# Patient Record
Sex: Male | Born: 1935 | Race: White | Hispanic: No | Marital: Married | Smoking: Former smoker
Health system: Southern US, Community
[De-identification: ages and names within clinical notes are randomized; demographics above are authoritative.]

## PROBLEM LIST (undated history)

## (undated) DIAGNOSIS — IMO0002 Reserved for concepts with insufficient information to code with codable children: Secondary | ICD-10-CM

## (undated) DIAGNOSIS — I252 Old myocardial infarction: Secondary | ICD-10-CM

## (undated) DIAGNOSIS — I10 Essential (primary) hypertension: Secondary | ICD-10-CM

## (undated) HISTORY — PX: OTHER SURGICAL HISTORY: SHX169

---

## 1991-08-29 DIAGNOSIS — I252 Old myocardial infarction: Secondary | ICD-10-CM

## 1991-08-29 HISTORY — DX: Old myocardial infarction: I25.2

## 1999-04-23 ENCOUNTER — Emergency Department (HOSPITAL_COMMUNITY): Admission: EM | Admit: 1999-04-23 | Discharge: 1999-04-24 | Payer: Self-pay | Admitting: Emergency Medicine

## 1999-05-04 ENCOUNTER — Emergency Department (HOSPITAL_COMMUNITY): Admission: EM | Admit: 1999-05-04 | Discharge: 1999-05-04 | Payer: Self-pay | Admitting: Internal Medicine

## 2012-05-20 DIAGNOSIS — IMO0002 Reserved for concepts with insufficient information to code with codable children: Secondary | ICD-10-CM

## 2012-05-20 HISTORY — PX: OTHER SURGICAL HISTORY: SHX169

## 2012-05-20 HISTORY — DX: Reserved for concepts with insufficient information to code with codable children: IMO0002

## 2012-07-09 ENCOUNTER — Encounter: Payer: Self-pay | Admitting: Radiation Oncology

## 2012-07-10 ENCOUNTER — Ambulatory Visit
Admission: RE | Admit: 2012-07-10 | Discharge: 2012-07-10 | Disposition: A | Discharge status: Home / Self Care | Payer: Medicare Other | Source: Ambulatory Visit | Attending: Radiation Oncology | Admitting: Radiation Oncology

## 2012-07-10 ENCOUNTER — Encounter: Payer: Self-pay | Admitting: Radiation Oncology

## 2012-07-10 VITALS — BP 159/76 | HR 58 | Temp 97.6°F | Wt 185.3 lb

## 2012-07-10 DIAGNOSIS — C44621 Squamous cell carcinoma of skin of unspecified upper limb, including shoulder: Secondary | ICD-10-CM | POA: Insufficient documentation

## 2012-07-10 DIAGNOSIS — I10 Essential (primary) hypertension: Secondary | ICD-10-CM | POA: Insufficient documentation

## 2012-07-10 DIAGNOSIS — IMO0002 Reserved for concepts with insufficient information to code with codable children: Secondary | ICD-10-CM

## 2012-07-10 DIAGNOSIS — I2510 Atherosclerotic heart disease of native coronary artery without angina pectoris: Secondary | ICD-10-CM | POA: Insufficient documentation

## 2012-07-10 DIAGNOSIS — Z9861 Coronary angioplasty status: Secondary | ICD-10-CM | POA: Insufficient documentation

## 2012-07-10 DIAGNOSIS — I252 Old myocardial infarction: Secondary | ICD-10-CM | POA: Insufficient documentation

## 2012-07-10 DIAGNOSIS — Z87891 Personal history of nicotine dependence: Secondary | ICD-10-CM | POA: Insufficient documentation

## 2012-07-10 DIAGNOSIS — Z09 Encounter for follow-up examination after completed treatment for conditions other than malignant neoplasm: Secondary | ICD-10-CM | POA: Insufficient documentation

## 2012-07-10 HISTORY — DX: Old myocardial infarction: I25.2

## 2012-07-10 HISTORY — DX: Reserved for concepts with insufficient information to code with codable children: IMO0002

## 2012-07-10 HISTORY — DX: Essential (primary) hypertension: I10

## 2012-07-10 NOTE — Progress Notes (Signed)
Here today for consultation for skin cancer on the left fourth finger.  Currently the area is red and without drainage with sheared skin. Demonstrates good movement of finger.  Dr. Alean Rinne has proposed surgery to the extent that a hand surgeon will be involved and Jon Lam would experience limitation of motion of finger and would require a skin graft so Jon Lam is exploring radiation as an alternative today.

## 2012-07-10 NOTE — Progress Notes (Signed)
Radiation Oncology         [phone] ________________________________  Initial outpatient Consultation  Name: Jon Lam MRN: [id number]  Date: 07/10/2012  DOB: 05-28-1932  CC:No primary provider on file.  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 male who is kindly sent by Dr. Alean Rinne of the [facility] for a skin cancer of the left fourth finger. 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 The patient and his wife report that this lesion has been slowly progressive over the past 2-3 decades. He has discussed surgery with Dr. Alean Rinne which would involve a skin graft and potential decrease in mobility of that finger in the long term. He is exploring other treatment options.   The patient is otherwise in his usual state of health. He is retired. He is very active with his hands and does a lot of work around the house.  He is here with his wife today of 52 years, who recently completed radiotherapy in our department. She is very supportive.  PREVIOUS RADIATION THERAPY: No  PAST MEDICAL HISTORY:  has a past medical history of Squamous cell carcinoma (05/20/12); Hypertension; and History of heart attack (1993).    PAST SURGICAL HISTORY: Past Surgical History  Procedure Date  . Cardia stents 2003 0r 2004  . Biopsy of finger 05/20/12    Left 4th finger    FAMILY HISTORY: family history includes Esophageal cancer in his paternal uncle; Lung cancer in his paternal aunt and paternal uncle; Pancreatic cancer in his brother; and Skin cancer in his father.  SOCIAL HISTORY:  reports that he quit smoking about 20 years ago. His smoking use included Cigarettes. He has a 80 pack-year smoking history. He does not have any smokeless tobacco history on file. He reports that he  does not drink alcohol or use illicit drugs.  ALLERGIES: Penicillins  MEDICATIONS:  Current Outpatient Prescriptions  Medication Sig Dispense Refill  . aspirin (ASPIRIN EC) 81 MG EC tablet Take 81 mg by mouth daily. Swallow whole.      . Coenzyme Q10 100 MG TABS Take by mouth.      . folic acid (FOLVITE) 1 MG tablet Take 1 mg by mouth daily.      Marland Kitchen lisinopril (PRINIVIL,ZESTRIL) 10 MG tablet Take 10 mg by mouth daily.      . metoprolol tartrate (LOPRESSOR) 25 MG tablet Take 25 mg by mouth once.      . polycarbophil (FIBERCON) 625 MG tablet Take 500 mg by mouth daily.      Bertram Gala Glycol-Propyl Glycol (SYSTANE) 0.4-0.3 % SOLN Apply 1 drop to eye as needed.      . pravastatin (PRAVACHOL) 40 MG tablet Take 40 mg by mouth daily.        REVIEW OF SYSTEMS:  Notable for that above. He reports relatively good range of motion in his left finger. He reports that the skin has started to crack /breakdown in the past few months over this lesion.   PHYSICAL EXAM:  weight is 185 lb 4.8 oz (84.052 kg). His temperature is 97.6 F (36.4 C). His blood pressure is 159/76 and his pulse is 58.   Well-nourished and in no acute distress. His left fourth digit demonstrates a  patch (approximately 3 cm dimension) of erythema/induration that wraps  around > one third the circumference of the mid finger, around the joint. Good range of motion in the finger. Good radial pulses in the left wrist. Finger nail bed is adequately perfused  LABORATORY DATA:  No results found for this basename: WBC,  HGB,  HCT,  MCV,  PLT   CMP  No results found for this basename: na,  k,  cl,  co2,  glucose,  bun,  creatinine,  calcium,  prot,  albumin,  ast,  alt,  alkphos,  bilitot,  gfrnonaa,  gfraa        RADIOGRAPHY: No results found.    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 Unfortunately, it is in the distal portion of the body  (finger)  which raises risks for radiotherapy. I explained to the patient that radiotherapy in this area would carry a risk of small vessel damage and nonhealing ulcers. In the worse case scenario, this could require an amputation of the finger if he does not heal adequately. He also understands that  radiotherapy to this area would require a significant dose to be given to the tendon and bone of the finger which could lead to healing issues and lack of mobility in the finger. Ultimately, I think the best option for him is to proceed with surgery. He is glad that he could meet with me to explore all of his options and now is more enthusiastic about surgery.  Of note, one of my senior partners examined the patient with me and was in agreement with this recommendation. He expressed the same thoughts/recommendations to the patient.  I spent 40 minutes minutes face to face with the patient and more than 50% of that time was spent in counseling and/or coordination of care.   __________________________________________   Lonie Peak, MD

## 2012-07-10 NOTE — Progress Notes (Signed)
See progress note under physician encounter. 

## 2012-07-10 NOTE — Addendum Note (Signed)
Encounter addended by: Stassi Fadely Mintz Zakaree Mcclenahan, RN on: 07/10/2012  8:10 PM<BR>     Documentation filed: Charges VN

## 2012-07-11 NOTE — Addendum Note (Signed)
Encounter addended by: Delynn Flavin, RN on: 07/11/2012  3:01 PM<BR>     Documentation filed: Charges VN

## 2012-07-11 NOTE — Addendum Note (Signed)
Encounter addended by: Delynn Flavin, RN on: 07/11/2012  5:46 PM<BR>     Documentation filed: Charges VN

## 2012-07-16 NOTE — Addendum Note (Signed)
Encounter addended by: Delynn Flavin, RN on: 07/16/2012 10:47 AM<BR>     Documentation filed: Charges VN

## 2012-07-16 NOTE — Addendum Note (Signed)
Encounter addended by: Delynn Flavin, RN on: 07/16/2012  5:58 PM<BR>     Documentation filed: Charges VN

## 2013-04-02 ENCOUNTER — Ambulatory Visit: Payer: Medicare Other | Attending: Orthopedic Surgery | Admitting: Physical Therapy

## 2013-04-02 DIAGNOSIS — M25559 Pain in unspecified hip: Secondary | ICD-10-CM | POA: Insufficient documentation

## 2013-04-02 DIAGNOSIS — IMO0001 Reserved for inherently not codable concepts without codable children: Secondary | ICD-10-CM | POA: Insufficient documentation

## 2013-04-08 ENCOUNTER — Ambulatory Visit: Payer: Medicare Other | Admitting: Physical Therapy

## 2013-04-11 ENCOUNTER — Ambulatory Visit: Payer: Medicare Other | Admitting: Physical Therapy

## 2013-04-15 ENCOUNTER — Ambulatory Visit: Payer: Medicare Other | Admitting: Physical Therapy

## 2013-04-17 ENCOUNTER — Ambulatory Visit: Payer: Medicare Other | Admitting: Physical Therapy

## 2013-04-22 ENCOUNTER — Ambulatory Visit: Payer: Medicare Other | Admitting: Physical Therapy

## 2013-04-24 ENCOUNTER — Ambulatory Visit: Payer: Medicare Other | Admitting: Physical Therapy

## 2019-07-16 ENCOUNTER — Emergency Department (HOSPITAL_BASED_OUTPATIENT_CLINIC_OR_DEPARTMENT_OTHER): Payer: Medicare Other

## 2019-07-16 ENCOUNTER — Other Ambulatory Visit: Payer: Self-pay

## 2019-07-16 ENCOUNTER — Encounter (HOSPITAL_BASED_OUTPATIENT_CLINIC_OR_DEPARTMENT_OTHER): Payer: Self-pay | Admitting: *Deleted

## 2019-07-16 ENCOUNTER — Emergency Department (HOSPITAL_BASED_OUTPATIENT_CLINIC_OR_DEPARTMENT_OTHER)
Admission: EM | Admit: 2019-07-16 | Discharge: 2019-07-16 | Disposition: A | Discharge status: Home / Self Care | Payer: Medicare Other | Attending: Emergency Medicine | Admitting: Emergency Medicine

## 2019-07-16 DIAGNOSIS — Z87891 Personal history of nicotine dependence: Secondary | ICD-10-CM | POA: Diagnosis not present

## 2019-07-16 DIAGNOSIS — Z79899 Other long term (current) drug therapy: Secondary | ICD-10-CM | POA: Diagnosis not present

## 2019-07-16 DIAGNOSIS — Z7982 Long term (current) use of aspirin: Secondary | ICD-10-CM | POA: Insufficient documentation

## 2019-07-16 DIAGNOSIS — R519 Headache, unspecified: Secondary | ICD-10-CM | POA: Insufficient documentation

## 2019-07-16 DIAGNOSIS — Z85828 Personal history of other malignant neoplasm of skin: Secondary | ICD-10-CM | POA: Insufficient documentation

## 2019-07-16 DIAGNOSIS — S0990XA Unspecified injury of head, initial encounter: Secondary | ICD-10-CM

## 2019-07-16 DIAGNOSIS — I252 Old myocardial infarction: Secondary | ICD-10-CM | POA: Insufficient documentation

## 2019-07-16 DIAGNOSIS — Y9389 Activity, other specified: Secondary | ICD-10-CM | POA: Insufficient documentation

## 2019-07-16 DIAGNOSIS — Y999 Unspecified external cause status: Secondary | ICD-10-CM | POA: Diagnosis not present

## 2019-07-16 DIAGNOSIS — I10 Essential (primary) hypertension: Secondary | ICD-10-CM | POA: Insufficient documentation

## 2019-07-16 DIAGNOSIS — Y929 Unspecified place or not applicable: Secondary | ICD-10-CM | POA: Diagnosis not present

## 2019-07-16 DIAGNOSIS — W228XXA Striking against or struck by other objects, initial encounter: Secondary | ICD-10-CM | POA: Diagnosis not present

## 2019-07-16 DIAGNOSIS — S0001XA Abrasion of scalp, initial encounter: Secondary | ICD-10-CM | POA: Insufficient documentation
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
## 2019-07-16 NOTE — Discharge Instructions (Signed)
Your CT scan of your head was reassuring today. Please take Tylenol as needed for a headache. Follow up with your PCP regarding your ED visit today as well as tetanus status.
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
## 2019-10-30 ENCOUNTER — Ambulatory Visit: Payer: Medicare Other | Attending: Internal Medicine

## 2019-10-30 DIAGNOSIS — Z23 Encounter for immunization: Secondary | ICD-10-CM

## 2019-10-30 NOTE — Progress Notes (Signed)
   Covid-19 Vaccination Clinic  Name:  LENNARD TUELL    MRN: [id number] DOB: 1932/03/10  10/30/2019  Mr. Plymire was observed post Covid-19 immunization for 15 minutes without incident. He was provided with Vaccine Information Sheet and instruction to access the V-Safe system.   Mr. Veasley was instructed to call 911 with any severe reactions post vaccine: Marland Kitchen Difficulty breathing  . Swelling of face and throat  . A fast heartbeat  . A bad rash all over body  . Dizziness and weakness

## 2019-10-31 ENCOUNTER — Telehealth: Payer: Self-pay | Admitting: *Deleted

## 2019-10-31 NOTE — Telephone Encounter (Signed)
Copied from [place] [id number]. Topic: General - Other >> Oct 31, 2019  9:04 AM Jodie Echevaria wrote: Reason for CRM: Patient daughter Janith Lima called to inquire of a nurse about the patient taking medication before getting his covid vaccine. Ph# [phone]  Contacted pt's daughter regarding his medication prior to his COVID vaccine; she states that the pt takes Aleve every morning for arthritis pain; explained that premedication for fever (Tylenol or Ibuprofen) is discouraged; she verbalized understanding.

## 2019-11-26 ENCOUNTER — Ambulatory Visit: Payer: Medicare Other | Attending: Internal Medicine

## 2019-11-26 DIAGNOSIS — Z23 Encounter for immunization: Secondary | ICD-10-CM

## 2019-11-26 NOTE — Progress Notes (Signed)
   Covid-19 Vaccination Clinic  Name:  Jon Lam    MRN: [id number] DOB: Jan 06, 1932  11/26/2019  [name] was observed post Covid-19 immunization for 15 minutes without incident. He was provided with Vaccine Information Sheet and instruction to access the V-Safe system.   [name] was instructed to call 911 with any severe reactions post vaccine: Marland Kitchen Difficulty breathing  . Swelling of face and throat  . A fast heartbeat  . A bad rash all over body  . Dizziness and weakness   Immunizations Administered    Name Date Dose VIS Date Route   Pfizer COVID-19 Vaccine 11/26/2019  9:35 AM 0.3 mL 08/08/2019 Intramuscular   Manufacturer: [employer], [employer]   Lot: [id number]   [place]: [id number]

## 2021-05-11 ENCOUNTER — Emergency Department (HOSPITAL_BASED_OUTPATIENT_CLINIC_OR_DEPARTMENT_OTHER)
Admission: EM | Admit: 2021-05-11 | Discharge: 2021-05-11 | Disposition: A | Discharge status: Home / Self Care | Payer: No Typology Code available for payment source | Attending: Emergency Medicine | Admitting: Emergency Medicine

## 2021-05-11 ENCOUNTER — Emergency Department (HOSPITAL_BASED_OUTPATIENT_CLINIC_OR_DEPARTMENT_OTHER): Payer: No Typology Code available for payment source

## 2021-05-11 ENCOUNTER — Other Ambulatory Visit: Payer: Self-pay

## 2021-05-11 ENCOUNTER — Encounter (HOSPITAL_BASED_OUTPATIENT_CLINIC_OR_DEPARTMENT_OTHER): Payer: Self-pay | Admitting: Emergency Medicine

## 2021-05-11 DIAGNOSIS — Z85828 Personal history of other malignant neoplasm of skin: Secondary | ICD-10-CM | POA: Diagnosis not present

## 2021-05-11 DIAGNOSIS — Z87891 Personal history of nicotine dependence: Secondary | ICD-10-CM | POA: Insufficient documentation

## 2021-05-11 DIAGNOSIS — S60011A Contusion of right thumb without damage to nail, initial encounter: Secondary | ICD-10-CM | POA: Insufficient documentation

## 2021-05-11 DIAGNOSIS — I10 Essential (primary) hypertension: Secondary | ICD-10-CM | POA: Insufficient documentation

## 2021-05-11 DIAGNOSIS — Y92410 Unspecified street and highway as the place of occurrence of the external cause: Secondary | ICD-10-CM | POA: Insufficient documentation

## 2021-05-11 DIAGNOSIS — Z79899 Other long term (current) drug therapy: Secondary | ICD-10-CM | POA: Insufficient documentation

## 2021-05-11 DIAGNOSIS — Z7982 Long term (current) use of aspirin: Secondary | ICD-10-CM | POA: Diagnosis not present

## 2021-05-11 DIAGNOSIS — M25511 Pain in right shoulder: Secondary | ICD-10-CM | POA: Diagnosis not present

## 2021-05-11 DIAGNOSIS — S6991XA Unspecified injury of right wrist, hand and finger(s), initial encounter: Secondary | ICD-10-CM | POA: Diagnosis present

## 2021-05-11 MED ORDER — DICLOFENAC SODIUM 1 % EX GEL: 2.0000 g | Freq: Four times a day (QID) | CUTANEOUS | 0 refills | Status: AC

## 2021-05-11 MED ORDER — DICLOFENAC SODIUM 1 % EX GEL: 2.0000 g | Freq: Four times a day (QID) | CUTANEOUS | 0 refills | Status: DC
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
## 2021-05-26 ENCOUNTER — Other Ambulatory Visit: Payer: Self-pay

## 2021-05-26 ENCOUNTER — Ambulatory Visit: Payer: PRIVATE HEALTH INSURANCE | Admitting: Surgical

## 2021-05-26 ENCOUNTER — Ambulatory Visit (INDEPENDENT_AMBULATORY_CARE_PROVIDER_SITE_OTHER): Payer: Medicare Other | Admitting: Orthopedic Surgery

## 2021-05-26 DIAGNOSIS — M25511 Pain in right shoulder: Secondary | ICD-10-CM | POA: Diagnosis not present

## 2021-05-30 ENCOUNTER — Encounter: Payer: Self-pay | Admitting: Orthopedic Surgery
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
## 2021-06-02 ENCOUNTER — Telehealth: Payer: Self-pay | Admitting: Orthopedic Surgery

## 2021-06-02 NOTE — Telephone Encounter (Signed)
Called patient left message on voicemail to return call and schedule an MRI review appointment with Dr Marlou Sa

## 2021-06-04 ENCOUNTER — Ambulatory Visit (HOSPITAL_BASED_OUTPATIENT_CLINIC_OR_DEPARTMENT_OTHER)
Admission: RE | Admit: 2021-06-04 | Discharge: 2021-06-04 | Disposition: A | Discharge status: Home / Self Care | Payer: Medicare Other | Source: Ambulatory Visit | Attending: Orthopedic Surgery | Admitting: Orthopedic Surgery

## 2021-06-04 ENCOUNTER — Other Ambulatory Visit: Payer: Self-pay

## 2021-06-04 DIAGNOSIS — M25511 Pain in right shoulder: Secondary | ICD-10-CM | POA: Diagnosis present

## 2021-06-15 ENCOUNTER — Other Ambulatory Visit: Payer: Self-pay

## 2021-06-15 ENCOUNTER — Ambulatory Visit (INDEPENDENT_AMBULATORY_CARE_PROVIDER_SITE_OTHER): Payer: Medicare Other | Admitting: Orthopedic Surgery

## 2021-06-15 DIAGNOSIS — M25511 Pain in right shoulder: Secondary | ICD-10-CM

## 2021-06-15 DIAGNOSIS — S46011A Strain of muscle(s) and tendon(s) of the rotator cuff of right shoulder, initial encounter: Secondary | ICD-10-CM | POA: Diagnosis not present

## 2021-06-19 ENCOUNTER — Encounter: Payer: Self-pay | Admitting: Orthopedic Surgery

## 2021-06-19 MED ORDER — BUPIVACAINE HCL 0.5 % IJ SOLN
9.0000 mL | INTRAMUSCULAR | Status: AC | PRN
  Administered 2021-06-15: 9 mL via INTRA_ARTICULAR

## 2021-06-19 MED ORDER — METHYLPREDNISOLONE ACETATE 40 MG/ML IJ SUSP
40.0000 mg | INTRAMUSCULAR | Status: AC | PRN
  Administered 2021-06-15: 40 mg via INTRA_ARTICULAR

## 2021-06-19 MED ORDER — LIDOCAINE HCL 1 % IJ SOLN
5.0000 mL | INTRAMUSCULAR | Status: AC | PRN
  Administered 2021-06-15: 5 mL

## 2021-06-19 NOTE — Progress Notes (Signed)
Office Visit Note   Patient: Jon Lam           Date of Birth: [birth year]           MRN: [id number] Visit Date: 06/15/2021 Requested by: Charleston Poot, MD [address] [place],  [place] [address] PCP: Charleston Poot, MD  Subjective: Chief Complaint  Patient presents with   Other    Scan review    HPI: Jon Lam is a 87 y.o. male who presents to the office for MRI review. Patient denies any changes in symptoms.  Continues to complain mainly of anterior lateral right shoulder pain.  No improvement since last visit according to him.  He still wakes with pain.  Particularly bothersome when he tries to use his 0 turn lawnmower.  He is also closer to considering intervention for his lumbar spine which is bothering him more than the shoulder.  MRI results revealed: MR SHOULDER RIGHT WO CONTRAST  Result Date: 06/06/2021 CLINICAL DATA:  MR right shoulder eval RCT EXAM: MRI OF THE RIGHT SHOULDER WITHOUT CONTRAST TECHNIQUE: Multiplanar, multisequence MR imaging of the shoulder was performed. No intravenous contrast was administered. COMPARISON:  Radiograph 05/11/2021 FINDINGS: Rotator cuff: High-grade, essentially full-thickness tears of the supraspinatus and infraspinatus tendons at the footprint, with few intact far anterior supraspinatus tendon fibers. Infraspinatus tearing is full width with 1.8 cm retraction. Teres minor tendon is intact. Subscapularis tendon is intact. Muscles: Reactive edema along the surfaces of the supraspinatus infraspinatus muscles and within the muscle belly of the infraspinatus. No significant muscle atrophy. Biceps Long Head: Intraarticular and extraarticular portions of the biceps tendon are intact. Acromioclavicular Joint: Moderate arthropathy of the acromioclavicular joint. Small amount of subacromial/subdeltoid bursal fluid likely related to the cuff tear. Glenohumeral Joint: Small glenohumeral joint effusion. Mild chondrosis. Labrum:  Degenerative posterosuperior labral tearing. Bones: No fracture or dislocation. No aggressive osseous lesion. Other: No additional findings. IMPRESSION: High-grade, essentially full-thickness tears of the supraspinatus and infraspinatus tendons at the footprint, with few intact far anterior supraspinatus tendon fibers. Some retracted infraspinatus fibers by approximately 1.8 cm. Distension of the subacromial-subdeltoid bursa. No significant muscle atrophy. Degenerative posterosuperior labral tearing. Moderate AC joint arthropathy. Electronically Signed   By: Maurine Simmering M.D.   On: 06/06/2021 16:50                 ROS: All systems reviewed are negative as they relate to the chief complaint within the history of present illness.  Patient denies fevers or chills.  Assessment & Plan: Visit Diagnoses:  1. Traumatic complete tear of right rotator cuff, initial encounter     Plan: Jon Lam is a 87 y.o. male who presents to the office for evaluation of right shoulder pain.  He is here to review MRI of the right shoulder.  MRI does reveal full-thickness tears of the supraspinatus and infraspinatus.  Most of his weakness seems to be external rotation with very well-preserved supraspinatus and subscapularis strength on examination today.  His main complaint is pain and his function is much improved compared with his last office visit.  At his last visit, he was only able to achieve 90 degrees of active forward flexion abduction but now he has full active range of motion of the right shoulder when compared with passive motion.  He states that this pain is bothering him enough to consider surgical intervention but with pain being the main complaint, he may have significant improvement with right shoulder injection.  Discussed surgical options available to the patient which would likely be primary repair of the torn rotator cuff, given his lack of atrophy.  Discussed recovery timeframe and risks and benefits of  the procedure.  With his medical history and advanced age, he would like to proceed with cortisone injection first to see how this does for him.  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  Right shoulder injection successfully administered and patient tolerated the procedure well.  He will follow-up with the office in mid December with consideration for surgery if no significant improvement.  Follow-Up Instructions: No follow-ups on file.   Orders:  No orders of the defined types were placed in this encounter.  No orders of the defined types were placed in this encounter.     Procedures: Large Joint Inj: R subacromial bursa on 06/15/2021 4:16 PM Indications: diagnostic evaluation and pain Details: 18 G 1.5 in needle, posterior approach  Arthrogram: No  Medications: 9 mL bupivacaine 0.5 %; 40 mg methylPREDNISolone acetate 40 MG/ML; 5 mL lidocaine 1 % Outcome: tolerated well, no immediate complications Procedure, treatment alternatives, risks and benefits explained, specific risks discussed. Consent was given by the patient. Immediately prior to procedure a time out was called to verify the correct patient, procedure, equipment, support staff and site/side marked as required. Patient was prepped and draped in the usual sterile fashion.      Clinical Data: No additional findings.  Objective: Vital Signs: There were no vitals taken for this visit.  Physical Exam:  Constitutional: Patient appears well-developed HEENT:  Head: Normocephalic Eyes:EOM are normal Neck: Normal range of motion Cardiovascular: Normal rate Pulmonary/chest: Effort normal Neurologic: Patient is alert Skin: Skin is warm Psychiatric: Patient has normal mood and affect  Ortho Exam: Ortho exam demonstrates right shoulder with 45 degrees external rotation, 95 degrees abduction, 160 degrees forward flexion.  Active range of motion equivalent to passive range of motion.  Excellent 5/5  strength of supraspinatus and subscapularis.  4/5 motor strength of external rotation of the right shoulder.  Negative external rotation lag sign.  Negative Hornblower sign.  Negative belly press test.  Axillary nerve intact with deltoid firing.  5/5 motor strength of bilateral grip strength, finger abduction, pronation/supination, bicep, tricep, deltoid.  Specialty Comments:  No specialty comments available.  Imaging: No results found.   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 07/10/2012   Past Medical History:  Diagnosis Date   History of heart attack 1993   Hypertension    Squamous cell carcinoma 05/20/12   Left fourth Garth Bigness with Actinic Keratoses    Family History  Problem Relation Age of Onset   Skin cancer Father    Lung cancer Paternal Uncle    Pancreatic cancer Brother    Lung cancer Paternal Aunt    Esophageal cancer Paternal Uncle     Past Surgical History:  Procedure Laterality Date   Biopsy of Finger  05/20/12   Left 4th finger   Cardia Stents  2003 0r 2004   Social History   Occupational History   Occupation: retired    Fish farm manager: [employer]  Tobacco Use   Smoking status: Former    Packs/day: 2.00    Years: 40.00    Pack years: 80.00    Types: Cigarettes    Quit date: 09/10/1991    Years since quitting: 29.7   Smokeless tobacco: Never  Substance and Sexual  Activity   Alcohol use: No   Drug use: No   Sexual activity: Not on file

## 2021-07-12 ENCOUNTER — Ambulatory Visit: Payer: Medicare Other | Attending: Physician Assistant | Admitting: Physical Therapy

## 2021-07-12 ENCOUNTER — Other Ambulatory Visit: Payer: Self-pay

## 2021-07-12 ENCOUNTER — Encounter: Payer: Self-pay | Admitting: Physical Therapy

## 2021-07-12 DIAGNOSIS — M79605 Pain in left leg: Secondary | ICD-10-CM | POA: Diagnosis present

## 2021-07-12 DIAGNOSIS — M25651 Stiffness of right hip, not elsewhere classified: Secondary | ICD-10-CM | POA: Diagnosis present

## 2021-07-12 DIAGNOSIS — M25652 Stiffness of left hip, not elsewhere classified: Secondary | ICD-10-CM | POA: Insufficient documentation

## 2021-07-12 DIAGNOSIS — R2689 Other abnormalities of gait and mobility: Secondary | ICD-10-CM | POA: Diagnosis present
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
## 2021-07-12 NOTE — Addendum Note (Signed)
Addended by: Hunt Oris on: 07/12/2021 02:57 PM   Modules accepted: Orders

## 2021-07-14 ENCOUNTER — Ambulatory Visit: Payer: Medicare Other | Admitting: Physical Therapy

## 2021-07-14 ENCOUNTER — Encounter: Payer: Self-pay | Admitting: Physical Therapy

## 2021-07-14 ENCOUNTER — Other Ambulatory Visit: Payer: Self-pay

## 2021-07-14 DIAGNOSIS — M79605 Pain in left leg: Secondary | ICD-10-CM

## 2021-07-14 DIAGNOSIS — M25651 Stiffness of right hip, not elsewhere classified: Secondary | ICD-10-CM

## 2021-07-14 DIAGNOSIS — M25652 Stiffness of left hip, not elsewhere classified: Secondary | ICD-10-CM | POA: Diagnosis not present

## 2021-07-14 DIAGNOSIS — R2689 Other abnormalities of gait and mobility: Secondary | ICD-10-CM
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
## 2021-07-19 ENCOUNTER — Ambulatory Visit: Payer: Medicare Other | Admitting: Physical Therapy

## 2021-07-19 ENCOUNTER — Other Ambulatory Visit: Payer: Self-pay

## 2021-07-19 DIAGNOSIS — M79605 Pain in left leg: Secondary | ICD-10-CM

## 2021-07-19 DIAGNOSIS — M25652 Stiffness of left hip, not elsewhere classified: Secondary | ICD-10-CM

## 2021-07-19 DIAGNOSIS — M25651 Stiffness of right hip, not elsewhere classified: Secondary | ICD-10-CM

## 2021-07-19 DIAGNOSIS — R2689 Other abnormalities of gait and mobility: Secondary | ICD-10-CM

## 2021-07-19 NOTE — Therapy (Signed)
[place]. [place], [place], [address] Phone: [phone]   Fax:  [phone]  Physical Therapy Treatment  Patient Details  Name: Jon Lam MRN: [id number] Date of Birth: 04/04/1932 Referring Provider (PT): Jon Bayley PA   Encounter Date: 07/19/2021   PT End of Session - 07/19/21 0916     Visit Number 3    Number of Visits 13    Date for PT Re-Evaluation 08/23/21    Authorization Type MCR and Combined    Authorization Time Period 07/12/21 to 08/23/21    PT Start Time 0835    PT Stop Time 0920    PT Time Calculation (min) 45 min             Past Medical History:  Diagnosis Date   History of heart attack 1993   Hypertension    Squamous cell carcinoma 05/20/12   Left fourth Jon Lam with Actinic Keratoses    Past Surgical History:  Procedure Laterality Date   Biopsy of Finger  05/20/12   Left 4th finger   Cardia Stents  2003 0r 2004    There were no vitals filed for this visit.   Subjective Assessment - 07/19/21 0835     Subjective HEP hurts and I just can't get the hang of it- but working on it. most pain back down left leg- shld are altogether different.sitting in chair and leaning away from left side gives some relief. pt states ready to see a surgeon- he was ready to operate 3 years ago    Currently in Pain? Yes    Pain Score 5                                [facility] Adult PT Treatment/Exercise - 07/19/21 0001       Lumbar Exercises: Aerobic   Nustep L 5 5 min      Lumbar Exercises: Seated   Sit to Stand 10 reps   wt ball chest press   Other Seated Lumbar Exercises dyna disc pelvic ROM 10 each with cuing   on disc LAQ,hip flex and abd 10 each BIL   Other Seated Lumbar Exercises black tband trunk ext 2 set s10   seated row 2 sets 10 green tband     Lumbar Exercises: Supine   Clam 15 reps   green tband   Bent Knee Raise 20 reps   green tband   Bridge  10 reps;Non-compliant   feet on ball plus KTC and obl   Other Supine Lumbar Exercises ball squeeze 15x      Manual Therapy   Manual Therapy Passive ROM;Neural Stretch;Manual Traction    Passive ROM LE and trunk    Manual Traction with Sheet - NE    Neural Stretch Left LE NT stretching                       PT Short Term Goals - 07/19/21 0917       PT SHORT TERM GOAL #1   Title Will be independent with appropriate initial HEP to be progressed PRN    Status Achieved      PT SHORT TERM GOAL #2   Title Will be able to walk at least 137f with LRAD and no increase in pain    Status Partially Met      PT SHORT TERM GOAL #3  Title Will report pain as being no more than 3/10 in intensity with functional activities with radiation no further than mid thigh    Status Partially Met      PT SHORT TERM GOAL #4   Title Will be able to state importance of not keeping wallet/hard objects in back pocket to prevent further compression of sciatic nerve in sitting    Status Achieved               PT Long Term Goals - 07/12/21 1216       PT LONG TERM GOAL #1   Title Will demonstrate at least a 50% improvement in lumbar ROM and hip flexibility in order to reduce pain and improve funcitonal activity tolerance    Time 6    Period Weeks    Status New    Target Date 08/23/21      PT LONG TERM GOAL #2   Title Will be able to tolerate walking community distances (>1042f) with LRAD with pain no more than 2/10 (and no radiation past ischial tuberosity) and no rest breaks    Time 6    Period Weeks    Status New      PT LONG TERM GOAL #3   Title Will be able to navigate stairs with U UE support and no increase in pain in order to improve general mobility    Time 6    Period Weeks    Status New      PT LONG TERM GOAL #4   Title Will demonstrate good biomechanics for floor to waist height lifting of 25# object in order to prevent recurrence/exacerbation of pain    Time 6     Period Weeks    Status New                   Plan - 07/19/21 0920     Clinical Impression Statement STG partially met. pt verb working on HEP And able ot demo. progressed ther ex today for ROM,stab and core actviation with cuing needed and he did well    PT Treatment/Interventions ADLs/Self Care Home Management;Cryotherapy;Electrical Stimulation;Iontophoresis 447mml Dexamethasone;Moist Heat;Ultrasound;Traction;DME Instruction;Gait training;Functional mobility training;Therapeutic activities;Therapeutic exercise;Balance training;Neuromuscular re-education;Patient/family education;Manual techniques;Dry needling;Taping    PT Next Visit Plan focus on general mobility, stretching, posture training, biomechanics             Patient will benefit from skilled therapeutic intervention in order to improve the following deficits and impairments:  Decreased mobility, Difficulty walking, Hypomobility, Improper body mechanics, Decreased activity tolerance, Impaired flexibility, Increased fascial restricitons, Postural dysfunction, Pain  Visit Diagnosis: Stiffness of left hip, not elsewhere classified  Stiffness of right hip, not elsewhere classified  Other abnormalities of gait and mobility  Pain in left leg     Problem List Patient Active Problem List   Diagnosis Date Noted   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 07/10/2012    Jon Lam,Jon Lam, PTA 07/19/2021, 9:21 AM  Jon Lam: [phone] Fax:  [phone]: Jon Lam: [id number] of Birth: 1105-24-1933

## 2021-07-26 ENCOUNTER — Ambulatory Visit: Payer: Medicare Other | Admitting: Physical Therapy

## 2021-07-26 ENCOUNTER — Other Ambulatory Visit: Payer: Self-pay

## 2021-07-26 DIAGNOSIS — M79605 Pain in left leg: Secondary | ICD-10-CM

## 2021-07-26 DIAGNOSIS — M25652 Stiffness of left hip, not elsewhere classified: Secondary | ICD-10-CM | POA: Diagnosis not present

## 2021-07-26 DIAGNOSIS — R2689 Other abnormalities of gait and mobility: Secondary | ICD-10-CM

## 2021-07-26 NOTE — Therapy (Signed)
[place]. 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   Subjective Assessment - 07/26/21 0842     Subjective left knee 9/10,hip 4/10. i think something else is going on with that knee. i hurt after last session    Currently in Pain? Yes                               [facility] Adult PT Treatment/Exercise - 07/26/21 0001       Lumbar Exercises: Aerobic   Nustep L 5 2mn      Lumbar Exercises: Machines for Strengthening   Cybex Lumbar Extension black tband 2 sets 10    Other Lumbar Machine Exercise row and lat 25# 2 sets 10      Lumbar Exercises: Seated   Other Seated Lumbar Exercises dyna disc pelvic ROM 10 each with cuing   LAQ,hip flex and abd on disc 10 x with red tband     Lumbar Exercises: Supine   Pelvic Tilt 10 reps   3 sec with tactile cuing   Clam 15 reps   tband   Bent Knee Raise 20 reps   tband     Knee/Hip Exercises: Standing   Other Standing Knee Exercises red tband hip ext and abd with UE support 10 x      Manual Therapy   Manual Therapy Passive ROM    Passive ROM LE and trunk                        PT Short Term Goals - 07/19/21 0917       PT SHORT TERM GOAL #1   Title Will be independent with appropriate initial HEP to be progressed PRN    Status Achieved      PT SHORT TERM GOAL #2   Title Will be able to walk at least 1077fwith LRAD and no increase in pain    Status Partially Met      PT SHORT TERM GOAL #3   Title Will report pain as being no more than 3/10 in intensity with functional activities with radiation no further than mid thigh    Status Partially Met      PT SHORT TERM GOAL #4   Title Will be able to state importance of not keeping wallet/hard objects in back pocket to prevent further compression of sciatic nerve in  sitting    Status Achieved               PT Long Term Goals - 07/12/21 1216       PT LONG TERM GOAL #1   Title Will demonstrate at least a 50% improvement in lumbar ROM and hip flexibility in order to reduce pain and improve funcitonal activity tolerance    Time 6    Period Weeks    Status New    Target Date 08/23/21      PT LONG TERM GOAL #2   Title Will be able to tolerate walking community distances (>1072f) with LRAD with pain no more than 2/10 (and no radiation past ischial tuberosity) and no rest breaks    Time 6    Period Weeks    Status New      PT LONG TERM GOAL #3   Title Will be able to navigate stairs with U UE support and no increase in pain in order to improve general mobility    Time 6    Period Weeks    Status New      PT LONG TERM GOAL #4   Title Will demonstrate good biomechanics for floor to waist height lifting of 25# object in order to prevent recurrence/exacerbation of pain    Time 6    Period Weeks    Status New                   Plan - 07/26/21 0919     Clinical Impression Statement progressed ex with wts and core stab with verb and tactile cuing needed. minimal increased pain after session. explained ot pt and daughter it will take time to get pt looser and stronger  which with hopefully decrease pain.    PT Treatment/Interventions ADLs/Self Care Home Management;Cryotherapy;Electrical Stimulation;Iontophoresis 411mml Dexamethasone;Moist Heat;Ultrasound;Traction;DME Instruction;Gait training;Functional mobility training;Therapeutic activities;Therapeutic exercise;Balance training;Neuromuscular re-education;Patient/family education;Manual techniques;Dry needling;Taping    PT Next Visit Plan focus on general mobility, stretching, posture training, biomechanics             Patient will benefit from skilled therapeutic intervention in order to improve the following deficits and impairments:  Decreased mobility, Difficulty walking, Hypomobility, Improper body mechanics, Decreased activity tolerance, Impaired flexibility, Increased fascial restricitons, Postural dysfunction, Pain  Visit Diagnosis: Stiffness of left hip, not elsewhere classified  Other abnormalities of gait and mobility  Pain in left leg     Problem List Patient Active Problem List   Diagnosis Date Noted   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

## 2021-07-28 ENCOUNTER — Ambulatory Visit: Payer: Medicare Other | Attending: Physician Assistant | Admitting: Physical Therapy

## 2021-07-28 ENCOUNTER — Other Ambulatory Visit: Payer: Self-pay

## 2021-07-28 DIAGNOSIS — R2689 Other abnormalities of gait and mobility: Secondary | ICD-10-CM | POA: Insufficient documentation

## 2021-07-28 DIAGNOSIS — M79605 Pain in left leg: Secondary | ICD-10-CM | POA: Diagnosis present

## 2021-07-28 DIAGNOSIS — M25652 Stiffness of left hip, not elsewhere classified: Secondary | ICD-10-CM | POA: Diagnosis present

## 2021-07-28 DIAGNOSIS — M25651 Stiffness of right hip, not elsewhere classified: Secondary | ICD-10-CM | POA: Insufficient documentation

## 2021-07-28 NOTE — Therapy (Signed)
[place]. [place], [place], [address] Phone: [phone]   Fax:  [phone]  Physical Therapy Treatment  Patient Details  Name: Jon Lam MRN: [id number] Date of Birth: April 01, 1932 Referring Provider (PT): Ignacia Bayley PA   Encounter Date: 07/28/2021   PT End of Session - 07/28/21 0909     Visit Number 5    Number of Visits 13    Date for PT Re-Evaluation 08/23/21    Authorization Type MCR and Combined    Authorization Time Period 07/12/21 to 08/23/21    PT Start Time 0830    PT Stop Time 0915    PT Time Calculation (min) 45 min             Past Medical History:  Diagnosis Date   History of heart attack 1993   Hypertension    Squamous cell carcinoma 05/20/12   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   Subjective Assessment - 07/28/21 0832     Subjective not to bad after last session. maybe it helped some. hip better this morning    Currently in Pain? Yes    Pain Score 7     Pain Location Knee                               [facility] Adult PT Treatment/Exercise - 07/28/21 0001       Lumbar Exercises: Aerobic   Nustep L 5 7 min      Lumbar Exercises: Machines for Strengthening   Cybex Lumbar Extension black tband 2 sets 10    Other Lumbar Machine Exercise row and lat 25# 2 sets 10      Lumbar Exercises: Seated   Sit to Stand 10 reps   wt ball chest press   Other Seated Lumbar Exercises multi wt ball core ex      Lumbar Exercises: Supine   Clam 20 reps   green tband   Bent Knee Raise 20 reps   green tband   Bridge with [employer]    Other Supine Lumbar Exercises feet on ball- bridge,KTC and obl 15 x each      Knee/Hip Exercises: Machines for Strengthening    Cybex Knee Extension 5# 2 sets 10    Cybex Knee Flexion 20# 2 sets 10      Knee/Hip Exercises: Standing   Walking with Sports Cord 20# 5 x fwd,3 x each side and backward      Manual Therapy   Manual Therapy Passive ROM    Passive ROM LE and trunk                       PT Short Term Goals - 07/19/21 0917       PT SHORT TERM GOAL #1   Title Will be independent with appropriate initial HEP to be progressed PRN    Status Achieved      PT SHORT TERM GOAL #2   Title Will be able to walk at least 175f with LRAD and no increase in pain    Status Partially Met      PT SHORT TERM GOAL #3  Title Will report pain as being no more than 3/10 in intensity with functional activities with radiation no further than mid thigh    Status Partially Met      PT SHORT TERM GOAL #4   Title Will be able to state importance of not keeping wallet/hard objects in back pocket to prevent further compression of sciatic nerve in sitting    Status Achieved               PT Long Term Goals - 07/28/21 0908       PT LONG TERM GOAL #1   Title Will demonstrate at least a 50% improvement in lumbar ROM and hip flexibility in order to reduce pain and improve funcitonal activity tolerance    Status Partially Met      PT LONG TERM GOAL #2   Title Will be able to tolerate walking community distances (>1098f) with LRAD with pain no more than 2/10 (and no radiation past ischial tuberosity) and no rest breaks    Status On-going      PT LONG TERM GOAL #3   Title Will be able to navigate stairs with U UE support and no increase in pain in order to improve general mobility    Status On-going      PT LONG TERM GOAL #4   Title Will demonstrate good biomechanics for floor to waist height lifting of 25# object in order to prevent recurrence/exacerbation of pain    Status Partially Met                   Plan - 07/28/21 0918     Clinical Impression Statement slow progression. pain varies  but does state maybe some better. explained to pt and daughter that it will take several weeks to see changes with strength and flexibility- continuing to progress as tolerates.    PT Treatment/Interventions ADLs/Self Care Home Management;Cryotherapy;Electrical Stimulation;Iontophoresis 455mml Dexamethasone;Moist Heat;Ultrasound;Traction;DME Instruction;Gait training;Functional mobility training;Therapeutic activities;Therapeutic exercise;Balance training;Neuromuscular re-education;Patient/family education;Manual techniques;Dry needling;Taping    PT Next Visit Plan focus on general mobility, stretching, posture training, biomechanics             Patient will benefit from skilled therapeutic intervention in order to improve the following deficits and impairments:  Decreased mobility, Difficulty walking, Hypomobility, Improper body mechanics, Decreased activity tolerance, Impaired flexibility, Increased fascial restricitons, Postural dysfunction, Pain  Visit Diagnosis: Stiffness of left hip, not elsewhere classified  Other abnormalities of gait and mobility  Pain in left leg  Stiffness of right hip, not elsewhere classified     Problem List Patient Active Problem List   Diagnosis Date Noted   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

## 2021-08-02 ENCOUNTER — Ambulatory Visit: Payer: Medicare Other | Admitting: Physical Therapy

## 2021-08-02 ENCOUNTER — Other Ambulatory Visit: Payer: Self-pay

## 2021-08-02 DIAGNOSIS — R2689 Other abnormalities of gait and mobility: Secondary | ICD-10-CM

## 2021-08-02 DIAGNOSIS — M25652 Stiffness of left hip, not elsewhere classified: Secondary | ICD-10-CM | POA: Diagnosis not present

## 2021-08-02 DIAGNOSIS — M79605 Pain in left leg: Secondary | ICD-10-CM

## 2021-08-02 NOTE — Therapy (Signed)
[place]. [place], [place], [address] Phone: [phone]   Fax:  [phone]  Physical Therapy Treatment  Patient Details  Name: Jon Lam MRN: [id number] Date of Birth: Jun 07, 1932 Referring Provider (PT): Ignacia Bayley PA   Encounter Date: 08/02/2021   PT End of Session - 08/02/21 0918     Visit Number 6    Number of Visits 13    Date for PT Re-Evaluation 08/23/21    Authorization Type MCR and Combined    PT Start Time 0840    PT Stop Time 2703    PT Time Calculation (min) 45 min             Past Medical History:  Diagnosis Date   History of heart attack 1993   Hypertension    Squamous cell carcinoma 05/20/12   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   Subjective Assessment - 08/02/21 0846     Subjective good and bad days. yesterday was rough    Currently in Pain? Yes    Pain Score 5     Pain Location Knee                               [facility] Adult PT Treatment/Exercise - 08/02/21 0001       Lumbar Exercises: Aerobic   Nustep L 5 7 min      Lumbar Exercises: Machines for Strengthening   Cybex Lumbar Extension black tband 2 sets 10    Other Lumbar Machine Exercise row and lat 25# 2 sets 10      Lumbar Exercises: Seated   Other Seated Lumbar Exercises pelvic ROM on dyna disc 15 x each plus LAQ 10 x for HS ROM      Lumbar Exercises: Supine   Clam 20 reps   green tband   Bent Knee Raise 20 reps   green tband   Bridge with [employer];15 reps    Other Supine Lumbar Exercises wt ball supine core stab    Other Supine Lumbar Exercises feet on ball bridge,KTC and obl      Knee/Hip Exercises: Machines for Strengthening   Cybex Knee Extension 5# 2 sets 10    Cybex  Knee Flexion 20# 2 sets 10      Knee/Hip Exercises: Supine   Straight Leg Raises Strengthening;Both;2 sets;10 reps   2 nd set with abd     Manual Therapy   Manual Therapy Passive ROM    Passive ROM LE and trunk                       PT Short Term Goals - 07/19/21 0917       PT SHORT TERM GOAL #1   Title Will be independent with appropriate initial HEP to be progressed PRN    Status Achieved      PT SHORT TERM GOAL #2   Title Will be able to walk at least 18f with LRAD and no increase in pain    Status Partially Met      PT SHORT TERM GOAL #3   Title Will report pain as being no more than  3/10 in intensity with functional activities with radiation no further than mid thigh    Status Partially Met      PT SHORT TERM GOAL #4   Title Will be able to state importance of not keeping wallet/hard objects in back pocket to prevent further compression of sciatic nerve in sitting    Status Achieved               PT Long Term Goals - 07/28/21 0908       PT LONG TERM GOAL #1   Title Will demonstrate at least a 50% improvement in lumbar ROM and hip flexibility in order to reduce pain and improve funcitonal activity tolerance    Status Partially Met      PT LONG TERM GOAL #2   Title Will be able to tolerate walking community distances (>1086f) with LRAD with pain no more than 2/10 (and no radiation past ischial tuberosity) and no rest breaks    Status On-going      PT LONG TERM GOAL #3   Title Will be able to navigate stairs with U UE support and no increase in pain in order to improve general mobility    Status On-going      PT LONG TERM GOAL #4   Title Will demonstrate good biomechanics for floor to waist height lifting of 25# object in order to prevent recurrence/exacerbation of pain    Status Partially Met                   Plan - 08/02/21 0918     Clinical Impression Statement progress strength and ROM with flexibility- cuing needed. noted  increased flexibility with PROM. working core to help strengthening    PT Treatment/Interventions ADLs/Self Care Home Management;Cryotherapy;Electrical Stimulation;Iontophoresis 432mml Dexamethasone;Moist Heat;Ultrasound;Traction;DME Instruction;Gait training;Functional mobility training;Therapeutic activities;Therapeutic exercise;Balance training;Neuromuscular re-education;Patient/family education;Manual techniques;Dry needling;Taping    PT Next Visit Plan focus on general mobility, stretching, posture training, biomechanics- CHECK GOALS             Patient will benefit from skilled therapeutic intervention in order to improve the following deficits and impairments:  Decreased mobility, Difficulty walking, Hypomobility, Improper body mechanics, Decreased activity tolerance, Impaired flexibility, Increased fascial restricitons, Postural dysfunction, Pain  Visit Diagnosis: Stiffness of left hip, not elsewhere classified  Other abnormalities of gait and mobility  Pain in left leg     Problem List Patient Active Problem List   Diagnosis Date Noted   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

## 2021-08-04 ENCOUNTER — Ambulatory Visit: Payer: Medicare Other | Admitting: Physical Therapy

## 2021-08-04 ENCOUNTER — Other Ambulatory Visit: Payer: Self-pay

## 2021-08-04 ENCOUNTER — Encounter: Payer: Self-pay | Admitting: Physical Therapy

## 2021-08-04 DIAGNOSIS — M79605 Pain in left leg: Secondary | ICD-10-CM

## 2021-08-04 DIAGNOSIS — M25651 Stiffness of right hip, not elsewhere classified: Secondary | ICD-10-CM

## 2021-08-04 DIAGNOSIS — R2689 Other abnormalities of gait and mobility: Secondary | ICD-10-CM

## 2021-08-04 DIAGNOSIS — M25652 Stiffness of left hip, not elsewhere classified: Secondary | ICD-10-CM

## 2021-08-04 NOTE — Therapy (Signed)
[place]. [place], [place], [address] Phone: [phone]   Fax:  [phone]  Physical Therapy Treatment  Patient Details  Name: DEAGLAN LILE MRN: [id number] Date of Birth: 1932/02/01 Referring Provider (PT): Ignacia Bayley PA   Encounter Date: 08/04/2021   PT End of Session - 08/04/21 0935     Visit Number 7    Number of Visits 13    Date for PT Re-Evaluation 08/23/21    PT Start Time 0840    PT Stop Time 0925    PT Time Calculation (min) 45 min    Activity Tolerance Patient tolerated treatment well    Behavior During Therapy [facility] for tasks assessed/performed             Past Medical History:  Diagnosis Date   History of heart attack 1993   Hypertension    Squamous cell carcinoma 05/20/12   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   Subjective Assessment - 08/04/21 0844     Subjective Reports that after the last treatment he had a very good day, then the next day it all was back, reports hurting today    Currently in Pain? Yes    Pain Score 5     Pain Location Leg    Pain Orientation Left;Lateral    Pain Descriptors / Indicators Sore;Aching    Aggravating Factors  worse as the day goes on    Pain Relieving Factors first thing in the morning I am good                               [facility] Adult PT Treatment/Exercise - 08/04/21 0001       Lumbar Exercises: Stretches   Passive Hamstring Stretch Right;Left;3 reps;20 seconds    Lower Trunk Rotation 4 reps;10 seconds    ITB Stretch Right;Left;3 reps;20 seconds      Lumbar Exercises: Aerobic   Nustep L 5 7 min      Lumbar Exercises: Machines for Strengthening   Cybex Lumbar Extension black tband 2 sets 10    Other Lumbar Machine  Exercise row and lat 25# 2 sets 10      Lumbar Exercises: Supine   Clam 20 reps    Clam Limitations green tband    Bent Knee Raise 20 reps    Bridge with [employer];15 reps      Knee/Hip Exercises: Machines for Strengthening   Cybex Knee Extension 5# 2 sets 10    Cybex Knee Flexion 20# 2 sets 10      Knee/Hip Exercises: Standing   Hip Abduction Both;2 sets;10 reps    Abduction Limitations 2.5#    Hip Extension Both;2 sets;10 reps    Extension Limitations 2.5#                       PT Short Term Goals - 07/19/21 2080       PT SHORT TERM GOAL #1   Title Will be independent with appropriate initial HEP to be progressed PRN    Status Achieved      PT SHORT TERM GOAL #2   Title Will be able  to walk at least 111f with LRAD and no increase in pain    Status Partially Met      PT SHORT TERM GOAL #3   Title Will report pain as being no more than 3/10 in intensity with functional activities with radiation no further than mid thigh    Status Partially Met      PT SHORT TERM GOAL #4   Title Will be able to state importance of not keeping wallet/hard objects in back pocket to prevent further compression of sciatic nerve in sitting    Status Achieved               PT Long Term Goals - 08/04/21 0937       PT LONG TERM GOAL #1   Title Will demonstrate at least a 50% improvement in lumbar ROM and hip flexibility in order to reduce pain and improve funcitonal activity tolerance    Status Partially Met      PT LONG TERM GOAL #2   Title Will be able to tolerate walking community distances (>10044f with LRAD with pain no more than 2/10 (and no radiation past ischial tuberosity) and no rest breaks    Status On-going      PT LONG TERM GOAL #3   Title Will be able to navigate stairs with U UE support and no increase in pain in order to improve general mobility    Status Partially Met      PT LONG TERM GOAL #4   Title Will demonstrate good biomechanics  for floor to waist height lifting of 25# object in order to prevent recurrence/exacerbation of pain    Status Partially Met                   Plan - 08/04/21 0935     Clinical Impression Statement Patient doing okay, reports good results for the rest of the day, his ROM/flexibility is improving but still tight and sore.  He is able to do all that is asked with some cues he just reports that as he does moe and moe on his feet the pain will increase    PT Next Visit Plan Continue to try to progress, he will see neurosurgeon in January    Consulted and Agree with Plan of Care Patient             Patient will benefit from skilled therapeutic intervention in order to improve the following deficits and impairments:  Decreased mobility, Difficulty walking, Hypomobility, Improper body mechanics, Decreased activity tolerance, Impaired flexibility, Increased fascial restricitons, Postural dysfunction, Pain  Visit Diagnosis: Stiffness of left hip, not elsewhere classified  Other abnormalities of gait and mobility  Pain in left leg  Stiffness of right hip, not elsewhere classified     Problem List Patient Active Problem List   Diagnosis Date Noted   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

## 2021-08-09 ENCOUNTER — Other Ambulatory Visit: Payer: Self-pay

## 2021-08-09 ENCOUNTER — Ambulatory Visit: Payer: Medicare Other | Admitting: Physical Therapy

## 2021-08-09 DIAGNOSIS — M25652 Stiffness of left hip, not elsewhere classified: Secondary | ICD-10-CM | POA: Diagnosis not present

## 2021-08-09 DIAGNOSIS — R2689 Other abnormalities of gait and mobility: Secondary | ICD-10-CM

## 2021-08-09 DIAGNOSIS — M79605 Pain in left leg: Secondary | ICD-10-CM

## 2021-08-09 NOTE — Therapy (Signed)
[facility]. [place], [place], [address] Phone: [phone]   Fax:  [phone]  Physical Therapy Treatment  Patient Details  Name: Jon Lam MRN: [id number] Date of Birth: [birth year] Referring Provider (PT): Ignacia Bayley PA   Encounter Date: 08/09/2021   PT End of Session - 08/09/21 0918     Visit Number 8    Number of Visits 13    Date for PT Re-Evaluation 08/23/21    Authorization Type MCR and Combined    Authorization Time Period 07/12/21 to 08/23/21    PT Start Time 0840    PT Stop Time 0920    PT Time Calculation (min) 40 min             Past Medical History:  Diagnosis Date   History of heart attack 1993   Hypertension    Squamous cell carcinoma 05/20/12   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   Subjective Assessment - 08/09/21 0846     Subjective bad pain last night, better today    Currently in Pain? Yes    Pain Score 5     Pain Location Leg    Pain Orientation Left                               [facility] Adult PT Treatment/Exercise - 08/09/21 0001       Self-Care   Self-Care Other Self-Care Comments;Lifting   educ on BM and safety with liftng and bending     Lumbar Exercises: Aerobic   Nustep L 5 7 min      Lumbar Exercises: Standing   Heel Raises 15 reps   black bar plus toe raises   Other Standing Lumbar Exercises act dead lift for HS stretch    Other Standing Lumbar Exercises wt ball trunk ext 10 x and obl 10 x      Lumbar Exercises: Seated   Sit to Stand 10 reps   wt ball press     Knee/Hip Exercises: Standing   Lateral Step Up Both;10 reps   opp leg abd   Forward Step Up Both;10 reps   opp leg ext   Walking with Sports Cord 30# 5 x fwd,3 x each side and  backward      Manual Therapy   Manual Therapy Passive ROM    Passive ROM LE and trunk                       PT Short Term Goals - 07/19/21 0917       PT SHORT TERM GOAL #1   Title Will be independent with appropriate initial HEP to be progressed PRN    Status Achieved      PT SHORT TERM GOAL #2   Title Will be able to walk at least 159f with LRAD and no increase in pain    Status Partially Met      PT SHORT TERM GOAL #3   Title Will report pain as being no more than 3/10 in intensity with functional activities with radiation no further than mid thigh    Status Partially Met  PT SHORT TERM GOAL #4   Title Will be able to state importance of not keeping wallet/hard objects in back pocket to prevent further compression of sciatic nerve in sitting    Status Achieved               PT Long Term Goals - 08/04/21 0937       PT LONG TERM GOAL #1   Title Will demonstrate at least a 50% improvement in lumbar ROM and hip flexibility in order to reduce pain and improve funcitonal activity tolerance    Status Partially Met      PT LONG TERM GOAL #2   Title Will be able to tolerate walking community distances (>1022f) with LRAD with pain no more than 2/10 (and no radiation past ischial tuberosity) and no rest breaks    Status On-going      PT LONG TERM GOAL #3   Title Will be able to navigate stairs with U UE support and no increase in pain in order to improve general mobility    Status Partially Met      PT LONG TERM GOAL #4   Title Will demonstrate good biomechanics for floor to waist height lifting of 25# object in order to prevent recurrence/exacerbation of pain    Status Partially Met                   Plan - 08/09/21 0918     Clinical Impression Statement pt states pain comes and goes and he is not getting the results he hoped for with PT and would like to hold after next session until sees MD Jan 11. educ pt on better BM for stooping and  lifting to decrease strain on back as well as propping to unload spine. pt was pain level increased with ex today but back to baseline with PROM.    PT Treatment/Interventions ADLs/Self Care Home Management;Cryotherapy;Electrical Stimulation;Iontophoresis 451mml Dexamethasone;Moist Heat;Ultrasound;Traction;DME Instruction;Gait training;Functional mobility training;Therapeutic activities;Therapeutic exercise;Balance training;Neuromuscular re-education;Patient/family education;Manual techniques;Dry needling;Taping    PT Next Visit Plan assess goals and HOLD next sesion at pt request             Patient will benefit from skilled therapeutic intervention in order to improve the following deficits and impairments:  Decreased mobility, Difficulty walking, Hypomobility, Improper body mechanics, Decreased activity tolerance, Impaired flexibility, Increased fascial restricitons, Postural dysfunction, Pain  Visit Diagnosis: Stiffness of left hip, not elsewhere classified  Other abnormalities of gait and mobility  Pain in left leg     Problem List Patient Active Problem List   Diagnosis Date Noted   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

## 2021-08-11 ENCOUNTER — Other Ambulatory Visit: Payer: Self-pay

## 2021-08-11 ENCOUNTER — Ambulatory Visit: Payer: Medicare Other | Admitting: Physical Therapy

## 2021-08-11 DIAGNOSIS — M25652 Stiffness of left hip, not elsewhere classified: Secondary | ICD-10-CM

## 2021-08-11 DIAGNOSIS — M79605 Pain in left leg: Secondary | ICD-10-CM

## 2021-08-11 NOTE — Therapy (Signed)
[facility]. [place], [place], [address] Phone: [phone]   Fax:  [phone]  Physical Therapy Treatment  Patient Details  Name: Jon Lam MRN: [id number] Date of Birth: 1932-06-30 Referring Provider (PT): Ignacia Bayley PA   Encounter Date: 08/11/2021   PT End of Session - 08/11/21 0914     Visit Number 9    Date for PT Re-Evaluation 08/23/21    Authorization Type MCR and Combined    Authorization Time Period 07/12/21 to 08/23/21    PT Start Time 0840    PT Stop Time 0925    PT Time Calculation (min) 45 min             Past Medical History:  Diagnosis Date   History of heart attack 1993   Hypertension    Squamous cell carcinoma 05/20/12   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   Subjective Assessment - 08/11/21 0847     Subjective I want to end therapy for now until i see MD- just not seeing results    Currently in Pain? Yes    Pain Score 5     Pain Location Leg    Pain Orientation Left                               [facility] Adult PT Treatment/Exercise - 08/11/21 0001       Lumbar Exercises: Aerobic   Nustep L 5 7 min      Lumbar Exercises: Machines for Strengthening   Cybex Lumbar Extension black tband 2 sets 10    Other Lumbar Machine Exercise row and lat 25# 2 sets 10      Lumbar Exercises: Seated   Sit to Stand 10 reps   wt ball press   Other Seated Lumbar Exercises isometric abdominals    Other Seated Lumbar Exercises wt ball core ex      Lumbar Exercises: Supine   Other Supine Lumbar Exercises feet on ball bridge,KTC and obl      Knee/Hip Exercises: Machines for Strengthening   Cybex Knee Extension 5# 2 sets 10    Cybex Knee Flexion 20# 2 sets 10      Knee/Hip  Exercises: Seated   Clamshell with TheraBand Green    Marching Strengthening;Both;10 reps   tband     Manual Therapy   Manual Therapy Passive ROM    Passive ROM LE and trunk                       PT Short Term Goals - 07/19/21 0917       PT SHORT TERM GOAL #1   Title Will be independent with appropriate initial HEP to be progressed PRN    Status Achieved      PT SHORT TERM GOAL #2   Title Will be able to walk at least 176f with LRAD and no increase in pain    Status Partially Met      PT SHORT TERM GOAL #3   Title Will report pain as being no more than 3/10 in intensity with functional activities with radiation no further than mid thigh  Status Partially Met      PT SHORT TERM GOAL #4   Title Will be able to state importance of not keeping wallet/hard objects in back pocket to prevent further compression of sciatic nerve in sitting    Status Achieved               PT Long Term Goals - 08/11/21 0908       PT LONG TERM GOAL #1   Title Will demonstrate at least a 50% improvement in lumbar ROM and hip flexibility in order to reduce pain and improve funcitonal activity tolerance    Status Partially Met      PT LONG TERM GOAL #2   Baseline can walk distance but pain higher    Status Partially Met      PT LONG TERM GOAL #3   Title Will be able to navigate stairs with U UE support and no increase in pain in order to improve general mobility    Status Partially Met      PT LONG TERM GOAL #4   Title Will demonstrate good biomechanics for floor to waist height lifting of 25# object in order to prevent recurrence/exacerbation of pain    Status Partially Met                   Plan - 08/11/21 0912     Clinical Impression Statement pt has decided to stop and hold PT as he is not seeing beneifts- would like ot f/u with surgeon in Jan before continuing. reviewed importance of doing HEP with stretching as well and using better BM with lifting and  bending    PT Treatment/Interventions ADLs/Self Care Home Management;Cryotherapy;Electrical Stimulation;Iontophoresis 27m/ml Dexamethasone;Moist Heat;Ultrasound;Traction;DME Instruction;Gait training;Functional mobility training;Therapeutic activities;Therapeutic exercise;Balance training;Neuromuscular re-education;Patient/family education;Manual techniques;Dry needling;Taping    PT Next Visit Plan HOLD             Patient will benefit from skilled therapeutic intervention in order to improve the following deficits and impairments:  Decreased mobility, Difficulty walking, Hypomobility, Improper body mechanics, Decreased activity tolerance, Impaired flexibility, Increased fascial restricitons, Postural dysfunction, Pain  Visit Diagnosis: Stiffness of left hip, not elsewhere classified  Pain in left leg     Problem List Patient Active Problem List   Diagnosis Date Noted   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

## 2021-08-15 ENCOUNTER — Ambulatory Visit: Payer: PRIVATE HEALTH INSURANCE | Admitting: Orthopedic Surgery

## 2021-08-17 ENCOUNTER — Ambulatory Visit (INDEPENDENT_AMBULATORY_CARE_PROVIDER_SITE_OTHER): Payer: Medicare Other | Admitting: Surgical

## 2021-08-17 ENCOUNTER — Encounter: Payer: Self-pay | Admitting: Orthopedic Surgery

## 2021-08-17 ENCOUNTER — Other Ambulatory Visit: Payer: Self-pay

## 2021-08-17 DIAGNOSIS — S46011A Strain of muscle(s) and tendon(s) of the rotator cuff of right shoulder, initial encounter: Secondary | ICD-10-CM

## 2021-08-17 NOTE — Progress Notes (Signed)
Office Visit Note   Patient: Jon Lam           Date of Birth: 05/03/1932           MRN: [id number] Visit Date: 08/17/2021 Requested by: Charleston Poot, MD [address] [place],  [place] [address] PCP: Charleston Poot, MD  Subjective: Chief Complaint  Patient presents with   Right Shoulder - Follow-up    HPI: Jon Lam is a 87 y.o. male who presents to the office for follow-up of right shoulder pain.  Patient had a right shoulder injection on 06/15/2021.  He states that he is currently doing very well and injection has pretty much resolved all of his symptoms.  He states that he has no pain in his right shoulder.  No difficulty sleeping at night and he is able to lay on that side.  He has no difficulty with lifting in his daily life and does not notice any discomfort with performing ADLs.  He feels that he is "back to normal".                ROS: All systems reviewed are negative as they relate to the chief complaint within the history of present illness.  Patient denies fevers or chills.  Assessment & Plan: Visit Diagnoses:  1. Traumatic complete tear of right rotator cuff, initial encounter     Plan: Patient is an 86 year old male who presents for repeat evaluation of right shoulder.  Has done very well following injection.  MRI from 06/04/2021 showed high-grade full-thickness tears of the supraspinatus and infraspinatus tendons.  Has pretty significant external rotation weakness on exam today but compensating very well for supraspinatus tear with great abduction strength today.  His main complaint was pain at his last appointment and this has significantly improved to the point where he feels "back to normal".  With such marked improvement, plan for patient to follow-up with the office as needed and no need for surgical intervention.  He will call the office and return if his pain returns or he has any other concerns.  Follow-Up Instructions: No follow-ups on  file.   Orders:  No orders of the defined types were placed in this encounter.  No orders of the defined types were placed in this encounter.     Procedures: No procedures performed   Clinical Data: No additional findings.  Objective: Vital Signs: There were no vitals taken for this visit.  Physical Exam:  Constitutional: Patient appears well-developed HEENT:  Head: Normocephalic Eyes:EOM are normal Neck: Normal range of motion Cardiovascular: Normal rate Pulmonary/chest: Effort normal Neurologic: Patient is alert Skin: Skin is warm Psychiatric: Patient has normal mood and affect  Ortho Exam: Ortho exam demonstrates right shoulder with preserved range of motion with no change since last exam.  Continued external rotation weakness rated 4/5 but has excellent 5/5 motor strength of supraspinatus and subscapularis.  No tenderness over the bicipital groove or AC joint.  Negative Neer and Hawkins impingement signs.  Negative external rotation lag sign.  Negative Hornblower sign.  Specialty Comments:  No specialty comments available.  Imaging: No results found.   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 07/10/2012   Past Medical History:  Diagnosis Date   History of heart attack 1993   Hypertension    Squamous cell carcinoma 05/20/12   Left fourth /finger with Actinic Keratoses    Family  History  Problem Relation Age of Onset   Skin cancer Father    Lung cancer Paternal Uncle    Pancreatic cancer Brother    Lung cancer Paternal Aunt    Esophageal cancer Paternal Uncle     Past Surgical History:  Procedure Laterality Date   Biopsy of Finger  05/20/12   Left 4th finger   Cardia Stents  2003 0r 2004   Social History   Occupational History   Occupation: retired    Fish farm manager: [employer]  Tobacco Use   Smoking status: Former    Packs/day: 2.00    Years: 40.00    Pack years: 80.00     Types: Cigarettes    Quit date: 09/10/1991    Years since quitting: 29.9   Smokeless tobacco: Never  Substance and Sexual Activity   Alcohol use: No   Drug use: No   Sexual activity: Not on file

## 2022-02-15 ENCOUNTER — Encounter: Payer: Self-pay | Admitting: Physical Therapy

## 2022-02-15 NOTE — Therapy (Signed)
[facility]. [place], [place], [address] Phone: [phone]   Fax:  [phone]  Patient Details  Name: Jon Lam MRN: [id number] Date of Birth: 20-Mar-1932 Referring Provider:  No ref. provider found  Encounter Date: 02/15/2022  PHYSICAL THERAPY DISCHARGE SUMMARY  Visits from Start of Care: 9  Current functional level related to goals / functional outcomes: Not seen since last scheduled session    Remaining deficits: Unable to assess    Education / Equipment: N/A    Patient agrees to discharge. Patient goals were partially met. Patient is being discharged due to the patient's request.   Ann Lions PT, DPT, PN2   Supplemental Physical Therapist [facility]. [place], [place], [address] Phone: [phone]   Fax:  [phone]

## 2022-02-24 ENCOUNTER — Ambulatory Visit: Payer: Medicare Other | Attending: Physician Assistant | Admitting: Physical Therapy

## 2022-02-24 ENCOUNTER — Encounter: Payer: Self-pay | Admitting: Physical Therapy

## 2022-02-24 DIAGNOSIS — M79605 Pain in left leg: Secondary | ICD-10-CM | POA: Insufficient documentation

## 2022-02-24 DIAGNOSIS — M5459 Other low back pain: Secondary | ICD-10-CM | POA: Insufficient documentation

## 2022-02-24 DIAGNOSIS — M25652 Stiffness of left hip, not elsewhere classified: Secondary | ICD-10-CM | POA: Diagnosis present

## 2022-02-24 DIAGNOSIS — R2689 Other abnormalities of gait and mobility: Secondary | ICD-10-CM | POA: Diagnosis present

## 2022-02-24 NOTE — Therapy (Signed)
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  ONSET DATE: 11/14/21  SUBJECTIVE:                                                                                                                                                                                           SUBJECTIVE STATEMENT: Patient underwent L3-4, L45 laminectomy on 11/14/21. He reports that he still has some pain in his L lateral leg to his knee, develops in the afternoons when he has been active. His  low back pain improves with extension. He reports L 4th and 5th digit numbness. He thinks it may have been from a phlebotomy puncture, or positioned during surgery. It started from his posterior L shoulder. R shoulder pain, possibly rotator cuff injury, hurts when he attempts to start his power equipment. PERTINENT HISTORY:  Patient underwent L3-4, L45 laminectomy on 11/14/21.  PAIN:  Are you having pain? Yes: NPRS scale: 0/10 Pain location: L lateral leg pain Pain description: burning Aggravating factors: mowing the lawn Relieving factors: Tylenol Pain can rise to 4/10   PRECAUTIONS: None  WEIGHT BEARING RESTRICTIONS  No  FALLS:  Has patient fallen in last 6 months? 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 Generalized tightness noted in lumbar paraspinals and lower trunk/hip musculature.  LUMBAR ROM:   Active  A/PROM  eval  Flexion Mid shin  Extension WNL  Right lateral flexion 3" >  knee  Left lateral flexion 2" > knee  Right rotation < 1/4  Left rotation <1/4   (Blank rows = not tested)  LOWER EXTREMITY ROM:   Generalized tightness throughout hips  LOWER EXTREMITY MMT:  4+/5 throughout  FUNCTIONAL TESTS:  5 times sit to stand: 13.15 Timed up and go (TUG): 12.56  GAIT: Distance walked: 58', Patient reports he walks about 300' at home. Assistive device utilized: None Level of assistance: Complete Independence Comments: Patient is limited in his walking distance, would like to be able to go further without fatigue.    TODAY'S TREATMENT  Education, ROM   PATIENT EDUCATION:  Education details: POC Person educated: Patient Education method: Explanation Education comprehension: verbalized understanding   HOME EXERCISE PROGRAM: TBD  ASSESSMENT:  CLINICAL IMPRESSION: Patient is a 88 y.o. who was seen today for physical therapy evaluation and treatment for Low back pain and weakness, R shoulder pain, L 4th and 5th digit tingling. He has lumbar surgery 3/23. He demonstrates good strength in limbs, but tightness in trunk and hips, decreased trunk stability, fluctuating pain. He will benefit from PT to address his deficiencies and for further assessment of his R shoulder rot  cuff.   OBJECTIVE IMPAIRMENTS decreased coordination, decreased endurance, decreased ROM, decreased strength, increased muscle spasms, and pain.   ACTIVITY LIMITATIONS carrying, lifting, bending, reach over head, and locomotion level  PARTICIPATION LIMITATIONS: yard work  Many Age and Past/current experiences are also affecting patient's functional outcome.   REHAB POTENTIAL: Good  CLINICAL DECISION MAKING: Stable/uncomplicated  EVALUATION COMPLEXITY: Low   GOALS: Goals reviewed with patient? Yes  SHORT TERM GOALS: Target date: 03/24/2022  I with initial hEP Baseline: Goal status: INITIAL  LONG TERM GOALS: Target date: 05/05/2022  I with final HEP Baseline:  Goal status: INITIAL  2.  Achieve at least 69 on FOTO Baseline: 60 Goal status: INITIAL  3.  Patient will report improved tolerance of his yardwork, at least 2 hours with pain/tingling < 3/5 Baseline: Reports pain with yardwork, up to 5/10 Goal status: INITIAL  4.  Patient will improve 5 x STS to < 10 sec Baseline: 13 Goal status: INITIAL  5.  Patient will report improved functional use of R UE without pain. Baseline: up to 5/10 Goal status: INITIAL     PLAN: PT FREQUENCY: 2x/week  PT DURATION: 10 weeks  PLANNED INTERVENTIONS: Therapeutic exercises, Therapeutic activity, Neuromuscular re-education, Balance training, Gait training, Patient/Family education, Joint mobilization, Dry Needling, Electrical stimulation, Moist heat, Taping, Ionotophoresis '4mg'$ /ml Dexamethasone, and Manual therapy.  PLAN FOR NEXT SESSION: Assess R shoulder for rot cuff, initiate HEP   Marcelina Morel, DPT 02/24/2022, 8:04 AM

## 2022-03-10 ENCOUNTER — Encounter: Payer: Self-pay | Admitting: Physical Therapy

## 2022-03-10 ENCOUNTER — Ambulatory Visit: Payer: Medicare Other | Attending: Physician Assistant | Admitting: Physical Therapy

## 2022-03-10 DIAGNOSIS — M25651 Stiffness of right hip, not elsewhere classified: Secondary | ICD-10-CM | POA: Insufficient documentation

## 2022-03-10 DIAGNOSIS — R2689 Other abnormalities of gait and mobility: Secondary | ICD-10-CM | POA: Insufficient documentation

## 2022-03-10 DIAGNOSIS — M25652 Stiffness of left hip, not elsewhere classified: Secondary | ICD-10-CM | POA: Insufficient documentation

## 2022-03-10 DIAGNOSIS — M5459 Other low back pain: Secondary | ICD-10-CM | POA: Insufficient documentation

## 2022-03-10 DIAGNOSIS — M79605 Pain in left leg: Secondary | ICD-10-CM | POA: Insufficient documentation

## 2022-03-10 NOTE — Therapy (Signed)
OUTPATIENT PHYSICAL THERAPY THORACOLUMBAR EVALUATION   Patient Name: Jon Lam MRN: [id number] DOB:September 16, 1931, 88 y.o., male Today's Date: 03/10/2022   PT End of Session - 03/10/22 0840     Visit Number 2    Date for PT Re-Evaluation 05/05/22    PT Start Time 0756    PT Stop Time 0842    PT Time Calculation (min) 46 min    Activity Tolerance Patient tolerated treatment well    Behavior During Therapy [facility] for tasks assessed/performed             Past Medical History:  Diagnosis Date   History of heart attack 1993   Hypertension    Squamous cell carcinoma 05/20/12   Left fourth Garth Bigness with Actinic Keratoses   Past Surgical History:  Procedure Laterality Date   Biopsy of Finger  05/20/12   Left 4th finger   Cardia Stents  2003 0r 2004   Patient Active Problem List   Diagnosis Date Noted   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 07/10/2012    PCP: Charleston Poot  REFERRING PROVIDER: Ignacia Bayley, PA-C   REFERRING DIAG: Diagnosis [phone] (ICD-10-CM) - Spinal stenosis, lumbar region with neurogenic claudication   Rationale for Evaluation and Treatment Rehabilitation  THERAPY DIAG:  Stiffness of left hip, not elsewhere classified  Stiffness of right hip, not elsewhere classified  Pain in left leg  Other abnormalities of gait and mobility  Other low back pain  ONSET DATE: 11/14/21  SUBJECTIVE:                                                                                                                                                                                           SUBJECTIVE STATEMENT: Patient reports sinus pain today. His back is not bothering him too much, but his hips and legs feel like they will give out on him. He has been very active and his legs are fatiguing. PERTINENT HISTORY:  Patient underwent L3-4, L45 laminectomy on 11/14/21.  PAIN:  Are you having pain? Yes: NPRS scale: 0/10 Pain location: L lateral leg pain Pain  description: burning Aggravating factors: mowing the lawn Relieving factors: Tylenol Pain can rise to 4/10   PRECAUTIONS: None  WEIGHT BEARING RESTRICTIONS No  FALLS:  Has patient fallen in last 6 months? 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 Generalized tightness noted in lumbar paraspinals and lower trunk/hip musculature.  LUMBAR ROM:   Active  A/PROM  eval  Flexion Mid shin  Extension WNL  Right lateral flexion 3" > knee  Left lateral flexion 2" > knee  Right rotation < 1/4  Left rotation <1/4   (Blank rows = not tested)  LOWER EXTREMITY ROM:   Generalized tightness throughout hips  LOWER EXTREMITY MMT:  4+/5 throughout  FUNCTIONAL TESTS:  5 times sit to stand: 13.15 Timed up and go (TUG): 12.56  GAIT: Distance walked: 69', Patient reports he walks about 300' at home. Assistive device utilized: None Level of assistance: Complete Independence Comments: Patient is limited in his walking distance, would like to be able to go further without fatigue.    TODAY'S TREATMENT  03/10/22 Hawkins test (+)-R STM to R pects with stretch, scapular mobs R Supine LE stretch-HS and SKTC-Patient reported increased neck strain, so deferred further supine stretching Supine bridge with G theraband abd resistance, SLR, 10 reps each Standing sh ext, rows, ER with G tband 10 each   02/24/22 Education, ROM   PATIENT EDUCATION:  Education details: POC Person educated: Patient Education method: Explanation Education comprehension: verbalized understanding   HOME  EXERCISE PROGRAM:  [id number]  ASSESSMENT:  CLINICAL IMPRESSION: Patient reportsleg fatigue. Initiated HEP for upper and lower trunk.   OBJECTIVE IMPAIRMENTS decreased coordination, decreased endurance, decreased ROM, decreased strength, increased muscle spasms, and pain.   ACTIVITY LIMITATIONS carrying, lifting, bending, reach over head, and locomotion level  PARTICIPATION LIMITATIONS: yard work  [place] Age and Past/current experiences are also affecting patient's functional outcome.   REHAB POTENTIAL: Good  CLINICAL DECISION MAKING: Stable/uncomplicated  EVALUATION COMPLEXITY: Low   GOALS: Goals reviewed with patient? Yes  SHORT TERM GOALS: Target date: 04/07/2022  I with initial hEP Baseline: Goal status: ongoing  LONG TERM GOALS: Target date: 05/05/22  I with final HEP Baseline:  Goal status: INITIAL  2.  Achieve at least 69 on FOTO Baseline: 60 Goal status: INITIAL  3.  Patient will report improved tolerance of his yardwork, at least 2 hours with pain/tingling < 3/5 Baseline: Reports pain with yardwork, up to 5/10 Goal status: INITIAL  4.  Patient will improve 5 x STS to < 10 sec Baseline: 13 Goal status: INITIAL  5.  Patient will report improved functional use of R UE without pain. Baseline: up to 5/10 Goal status: INITIAL     PLAN: PT FREQUENCY: 2x/week  PT DURATION: 10 weeks  PLANNED INTERVENTIONS: Therapeutic exercises, Therapeutic activity, Neuromuscular re-education, Balance training, Gait training, Patient/Family education, Joint mobilization, Dry Needling, Electrical stimulation, Moist heat, Taping, Ionotophoresis '4mg'$ /ml Dexamethasone, and Manual therapy.  PLAN FOR NEXT SESSION: Assess R shoulder for rot cuff, initiate HEP   Marcelina Morel, DPT 03/10/2022, 8:49 AM

## 2022-03-17 ENCOUNTER — Ambulatory Visit: Payer: Medicare Other | Admitting: Physical Therapy

## 2022-03-17 DIAGNOSIS — M5459 Other low back pain: Secondary | ICD-10-CM

## 2022-03-17 DIAGNOSIS — M79605 Pain in left leg: Secondary | ICD-10-CM | POA: Diagnosis present

## 2022-03-17 DIAGNOSIS — R2689 Other abnormalities of gait and mobility: Secondary | ICD-10-CM | POA: Diagnosis present

## 2022-03-17 DIAGNOSIS — M25652 Stiffness of left hip, not elsewhere classified: Secondary | ICD-10-CM | POA: Diagnosis not present

## 2022-03-17 DIAGNOSIS — M25651 Stiffness of right hip, not elsewhere classified: Secondary | ICD-10-CM | POA: Diagnosis present

## 2022-03-17 IMAGING — MR MR SHOULDER*R* W/O CM
6 series · 40 of 40 positions shown · non-contrast
Comparison: Radiograph 05/11/2021

CLINICAL DATA: MR right shoulder eval RCT

EXAM:
MRI OF THE RIGHT SHOULDER WITHOUT CONTRAST
TECHNIQUE: Multiplanar, multisequence MR imaging of the shoulder was performed.
No intravenous contrast was administered.

[Series 3: T2 fat-sat · axial · 4.0mm · 0.55mm/px · z∈[-25,+71]mm · 5 of 21 slices shown (1 of 3)]
[im 1/21]
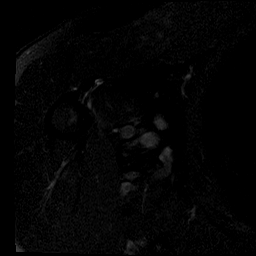
[im 6/21]
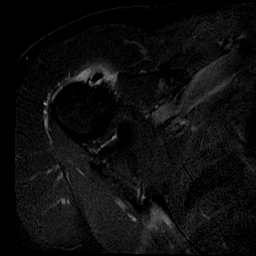
[im 11/21]
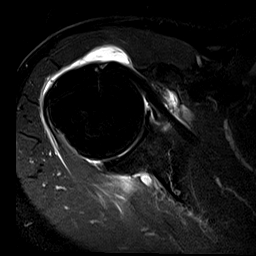
[im 16/21]
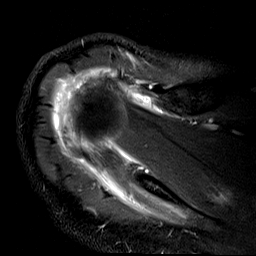
[im 21/21]
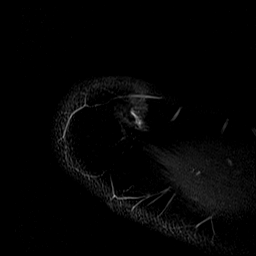

[Series 4: T2 fat-sat · oblique · 4.0mm · 0.59mm/px · 7 of 22 slices shown (2 of 3)]
[im 1/22]
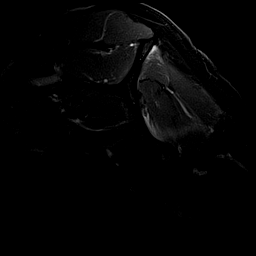
[im 4/22]
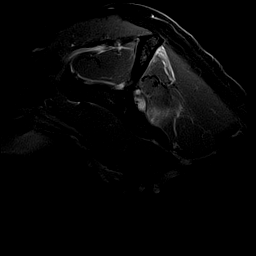
[im 8/22]
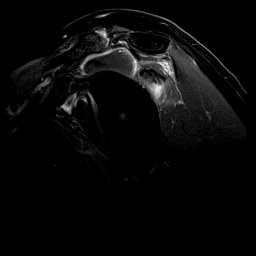
[im 11/22]
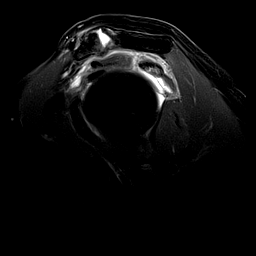
[im 15/22]
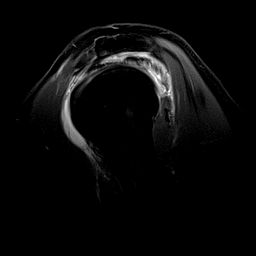
[im 18/22]
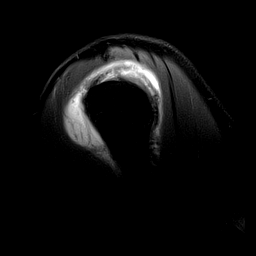
[im 22/22]
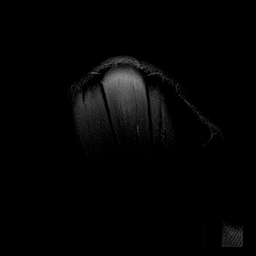

[Series 5: T1 · oblique · 4.0mm · 0.59mm/px · 7 of 22 slices shown (1 of 2)]
[im 1/22]
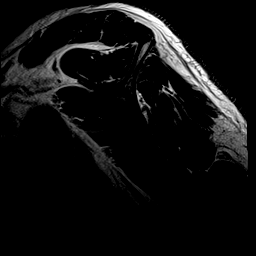
[im 4/22]
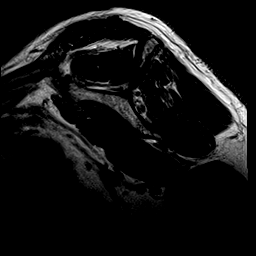
[im 8/22]
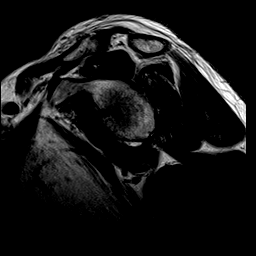
[im 11/22]
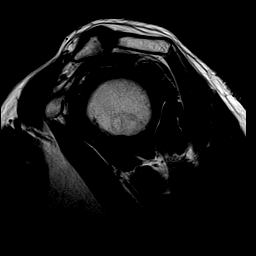
[im 15/22]
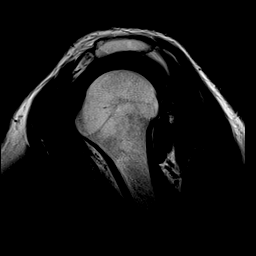
[im 18/22]
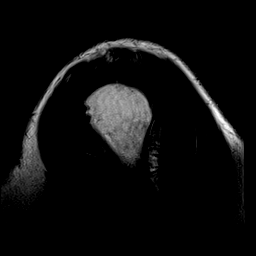
[im 22/22]
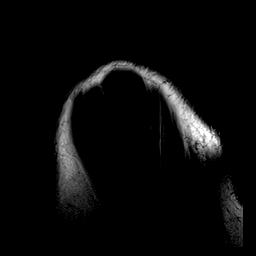

[Series 6: T2 fat-sat · oblique · 4.0mm · 0.55mm/px · 7 of 22 slices shown (3 of 3)]
[im 1/22]
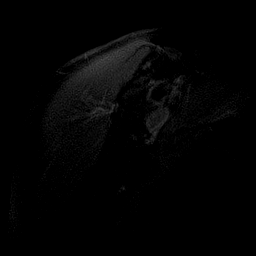
[im 4/22]
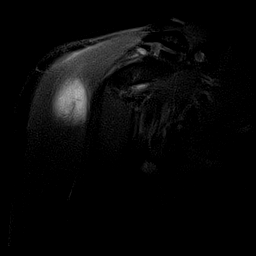
[im 8/22]
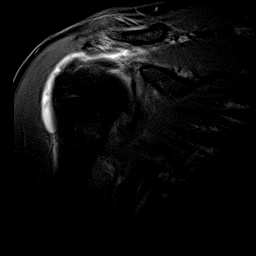
[im 11/22]
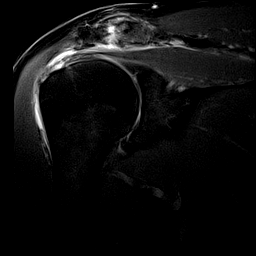
[im 15/22]
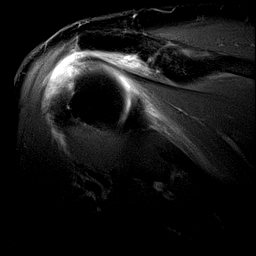
[im 18/22]
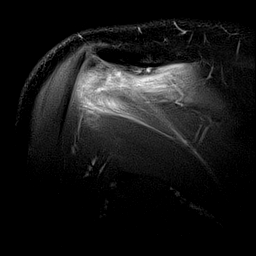
[im 22/22]
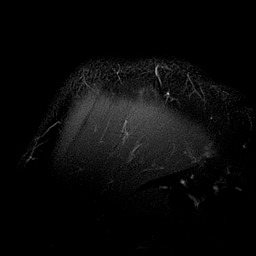

[Series 7: PD · oblique · 4.0mm · 0.59mm/px · 7 of 22 slices shown]
[im 1/22]
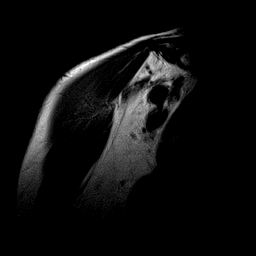
[im 4/22]
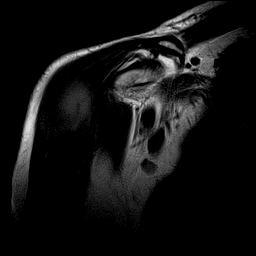
[im 8/22]
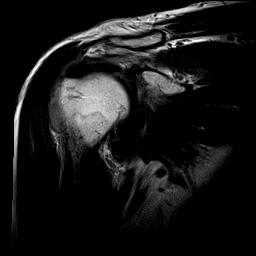
[im 11/22]
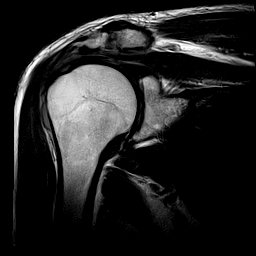
[im 15/22]
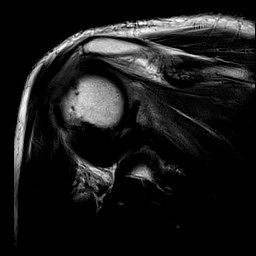
[im 18/22]
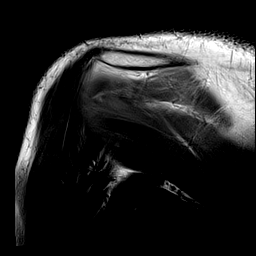
[im 22/22]
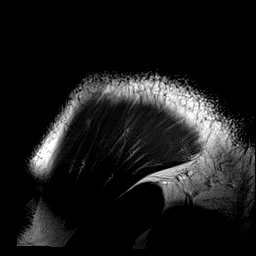

[Series 8: T1 · oblique · 4.0mm · 0.59mm/px · 7 of 22 slices shown (2 of 2)]
[im 1/22]
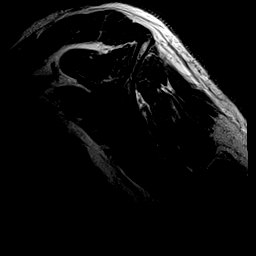
[im 4/22]
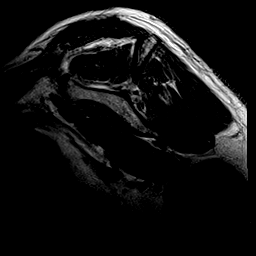
[im 8/22]
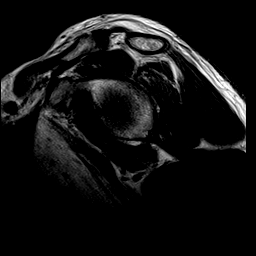
[im 11/22]
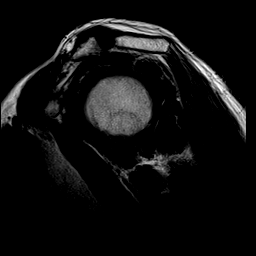
[im 15/22]
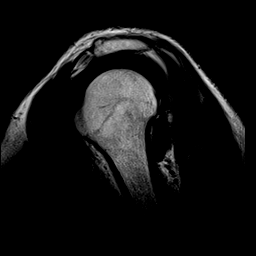
[im 18/22]
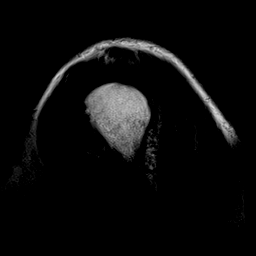
[im 22/22]
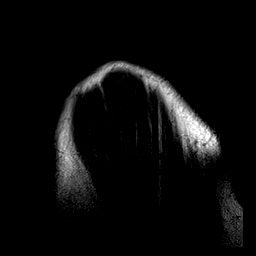

[40 of 40 positions shown; findings below may reference images not displayed]

FINDINGS: Rotator cuff: High-grade, essentially full-thickness tears of the
supraspinatus and infraspinatus tendons at the footprint, with few
intact far anterior supraspinatus tendon fibers. Infraspinatus
tearing is full width with 1.8 cm retraction. Teres minor tendon is
intact. Subscapularis tendon is intact.

Muscles: Reactive edema along the surfaces of the supraspinatus
infraspinatus muscles and within the muscle belly of the
infraspinatus. No significant muscle atrophy.

Biceps Long Head: Intraarticular and extraarticular portions of the
biceps tendon are intact.

Acromioclavicular Joint: Moderate arthropathy of the
acromioclavicular joint. Small amount of subacromial/subdeltoid
bursal fluid likely related to the cuff tear.

Glenohumeral Joint: Small glenohumeral joint effusion. Mild
chondrosis.

Labrum: Degenerative posterosuperior labral tearing.

Bones: No fracture or dislocation. No aggressive osseous lesion.

Other: No additional findings.
IMPRESSION: High-grade, essentially full-thickness tears of the supraspinatus
and infraspinatus tendons at the footprint, with few intact far
anterior supraspinatus tendon fibers. Some retracted infraspinatus
fibers by approximately 1.8 cm. Distension of the
subacromial-subdeltoid bursa. No significant muscle atrophy.

Degenerative posterosuperior labral tearing.

Moderate AC joint arthropathy.

## 2022-03-17 NOTE — Therapy (Signed)
OUTPATIENT PHYSICAL THERAPY THORACOLUMBAR EVALUATION   Patient Name: Jon Lam MRN: [id number] DOB:09/23/1931, 88 y.o., male Today's Date: 03/17/2022   PT End of Session - 03/17/22 0838     Visit Number 3    Date for PT Re-Evaluation 05/05/22    PT Start Time 0838    PT Stop Time 0925    PT Time Calculation (min) 47 min             Past Medical History:  Diagnosis Date   History of heart attack 1993   Hypertension    Squamous cell carcinoma 05/20/12   Left fourth /finger with Actinic Keratoses   Past Surgical History:  Procedure Laterality Date   Biopsy of Finger  05/20/12   Left 4th finger   Cardia Stents  2003 0r 2004   Patient Active Problem List   Diagnosis Date Noted   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 07/10/2012    PCP: Ruehle,  Stephen  REFERRING PROVIDER: Avila-Duran, Carolyn, PA-C   REFERRING DIAG: Diagnosis M48.062 (ICD-10-CM) - Spinal stenosis, lumbar region with neurogenic claudication   Rationale for Evaluation and Treatment Rehabilitation  THERAPY DIAG:  Other low back pain  Other abnormalities of gait and mobility  ONSET DATE: 11/14/21  SUBJECTIVE:                                                                                                                                                                                           SUBJECTIVE STATEMENT: sore across LB, only good until about 12:30 / 1 oclock then my legs are so fatigued and feel like they will give out. Shld is doing okay- comes and goes PERTINENT HISTORY:  Patient underwent L3-4, L45 laminectomy on 11/14/21.  PAIN:  Are you having pain? Yes:  LB   PRECAUTIONS: None  WEIGHT BEARING RESTRICTIONS No  FALLS:  Has patient fallen in last 6 months? 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 Generalized tightness noted in lumbar paraspinals and lower trunk/hip musculature.  LUMBAR ROM:   Active  A/PROM  eval  Flexion Mid shin  Extension WNL  Right lateral flexion 3" > knee  Left lateral flexion 2" > knee  Right rotation < 1/4  Left rotation <1/4   (Blank rows = not tested)  LOWER EXTREMITY ROM:   Generalized tightness   throughout hips  LOWER EXTREMITY MMT:  4+/5 throughout  FUNCTIONAL TESTS:  5 times sit to stand: 13.15 Timed up and go (TUG): 12.56  03/17/22 5 x STS: 11.33 TUG: no AD 9.32 sec  TODAY'S TREATMENT   03/17/22  Nustep L 4 6  min HS curl 20# 2 sets 10 Knee ext 10 # 2 sets 10 10# cable pulleys shld ext and row 12 each Black tband trunk ext 2 sets 10 Lat pull 20# 2 sets 10 Leg Press 40# 3 sets 10 STS with wt ball press 10 x Supine feet on ball bridge and trunk rotation 2 sets 10 each BIL LE and trunk PROM/stretch Postural cuing needed through out as he tends to hold head down and has rounded shlds        03/10/22 Hawkins test (+)-R STM to R pects with stretch, scapular mobs R Supine LE stretch-HS and SKTC-Patient reported increased neck strain, so deferred further supine stretching Supine bridge with G theraband abd resistance, SLR, 10 reps each Standing sh ext, rows, ER with G tband 10 each   02/24/22 Education, ROM   PATIENT EDUCATION:  Education details: LE stretching Person educated: Patient Education method: Explanation Education comprehension: verbalized understanding   HOME EXERCISE PROGRAM:  [id number]  ASSESSMENT:  CLINICAL IMPRESSION: pt arrived with chief complaint of LE weakness and fatigue by mid afternoon. Focus session on LE and core strengthening with cuing for better posture with ex as he tends to hold down and rounded shlds. Pt very active at home but doing HEP some- encouraged ex  and stretching at home   OBJECTIVE IMPAIRMENTS decreased coordination, decreased endurance, decreased ROM, decreased strength, increased muscle spasms, and pain.   ACTIVITY LIMITATIONS carrying, lifting, bending, reach over head, and locomotion level  PARTICIPATION LIMITATIONS: yard work  PERSONAL FACTORS Age and Past/current experiences are also affecting patient's functional outcome.   REHAB POTENTIAL: Good  CLINICAL DECISION MAKING: Stable/uncomplicated  EVALUATION COMPLEXITY: Low   GOALS: Goals reviewed with patient? Yes  SHORT TERM GOALS: Target date: 04/07/2022  I with initial hEP Baseline: Goal status: met 03/17/22  LONG TERM GOALS: Target date: 05/05/22  I with final HEP Baseline:  Goal status: INITIAL  2.  Achieve at least 69 on FOTO Baseline: 60 Goal status: INITIAL  3.  Patient will report improved tolerance of his yardwork, at least 2 hours with pain/tingling < 3/5 Baseline: Reports pain with yardwork, up to 5/10 Goal status: INITIAL  4.  Patient will improve 5 x STS to < 10 sec Baseline: 13 Goal status: met 03/17/22  5.  Patient will report improved functional use of R UE without pain. Baseline: up to 5/10 Goal status: INITIAL     PLAN: PT FREQUENCY: 2x/week  PT DURATION: 10 weeks  PLANNED INTERVENTIONS: Therapeutic exercises, Therapeutic activity, Neuromuscular re-education, Balance training, Gait training, Patient/Family education, Joint mobilization, Dry Needling, Electrical stimulation, Moist heat, Taping, Ionotophoresis 4mg/ml Dexamethasone, and Manual therapy.  PLAN FOR NEXT SESSION: progress func strength and stretching  Angela Payseur PTA 03/17/2022, 8:39 AM  

## 2022-03-20 ENCOUNTER — Ambulatory Visit: Payer: Medicare Other

## 2022-03-20 DIAGNOSIS — M5459 Other low back pain: Secondary | ICD-10-CM

## 2022-03-20 DIAGNOSIS — R2689 Other abnormalities of gait and mobility: Secondary | ICD-10-CM

## 2022-03-20 DIAGNOSIS — M25651 Stiffness of right hip, not elsewhere classified: Secondary | ICD-10-CM

## 2022-03-20 DIAGNOSIS — M25652 Stiffness of left hip, not elsewhere classified: Secondary | ICD-10-CM | POA: Diagnosis not present

## 2022-03-20 DIAGNOSIS — M79605 Pain in left leg: Secondary | ICD-10-CM

## 2022-03-20 NOTE — Therapy (Signed)
OUTPATIENT PHYSICAL THERAPY THORACOLUMBAR EVALUATION   Patient Name: Jon Lam MRN: [id number] DOB:[birth year], 88 y.o., male Today's Date: 03/20/2022   PT End of Session - 03/20/22 0802     Visit Number 4    Date for PT Re-Evaluation 05/05/22    PT Start Time 0800    PT Stop Time 0845    PT Time Calculation (min) 45 min    Activity Tolerance Patient tolerated treatment well    Behavior During Therapy [facility] for tasks assessed/performed             Past Medical History:  Diagnosis Date   History of heart attack 1993   Hypertension    Squamous cell carcinoma 05/20/12   Left fourth Garth Bigness with Actinic Keratoses   Past Surgical History:  Procedure Laterality Date   Biopsy of Finger  05/20/12   Left 4th finger   Cardia Stents  2003 0r 2004   Patient Active Problem List   Diagnosis Date Noted   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 07/10/2012    PCP: Charleston Poot  REFERRING PROVIDER: Ignacia Bayley, PA-C   REFERRING DIAG: Diagnosis [phone] (ICD-10-CM) - Spinal stenosis, lumbar region with neurogenic claudication   Rationale for Evaluation and Treatment Rehabilitation  THERAPY DIAG:  Other low back pain  Other abnormalities of gait and mobility  Stiffness of left hip, not elsewhere classified  Stiffness of right hip, not elsewhere classified  Pain in left leg  ONSET DATE: 11/14/21  SUBJECTIVE:                                                                                                                                                                                           SUBJECTIVE STATEMENT: Im just a little sore.   PERTINENT HISTORY:  Patient underwent L3-4, L45 laminectomy on 11/14/21.  PAIN:  Are you having pain? Yes:3/10 lower back   PRECAUTIONS: None  WEIGHT BEARING RESTRICTIONS No  FALLS:  Has patient fallen in last 6 months? No  LIVING ENVIRONMENT: Lives with: lives alone Lives in: House/apartment   OCCUPATION:  retired-does yard work  PLOF: Independent  PATIENT GOALS Decreased pain   OBJECTIVE:    PATIENT SURVEYS:  FOTO 60.8  MUSCLE LENGTH: Patient with tightness in B HS and hip flexors  POSTURE: forward head  PALPATION: Patient reports no TTP. Generalized tightness noted in lumbar paraspinals and lower trunk/hip musculature.  LUMBAR ROM:   Active  A/PROM  eval  Flexion Mid shin  Extension WNL  Right lateral flexion 3" > knee  Left lateral flexion 2" > knee  Right rotation < 1/4  Left rotation <  1/4   (Blank rows = not tested)  LOWER EXTREMITY ROM:   Generalized tightness throughout hips  LOWER EXTREMITY MMT:  4+/5 throughout  FUNCTIONAL TESTS:  5 times sit to stand: 13.15 Timed up and go (TUG): 12.56  03/17/22 5 x STS: 11.33 TUG: no AD 9.32 sec  TODAY'S TREATMENT  03/20/22 UBE L2 x 6 min Lat pull down/seated rows 25# 2x10 Standing shoulder extension/rows 10# 2x10 Reistsed gait 4 way 20# x 4 each Leg extension 10# 2x10 HS curls 20# 2x10 Feet on ball KTC and bridges 1x10   03/17/22  Nustep L 4 6  min HS curl 20# 2 sets 10 Knee ext 10 # 2 sets 10 10# cable pulleys shld ext and row 12 each Black tband trunk ext 2 sets 10 Lat pull 20# 2 sets 10 Leg Press 40# 3 sets 10 STS with wt ball press 10 x Supine feet on ball bridge and trunk rotation 2 sets 10 each BIL LE and trunk PROM/stretch Postural cuing needed through out as he tends to hold head down and has rounded shlds        03/10/22 Hawkins test (+)-R STM to R pects with stretch, scapular mobs R Supine LE stretch-HS and SKTC-Patient reported increased neck strain, so deferred further supine stretching Supine bridge with G theraband abd resistance, SLR, 10 reps each Standing sh ext, rows, ER with G tband 10 each   02/24/22 Education, ROM   PATIENT EDUCATION:  Education details: None Person educated: Patient Education method: Explanation Education comprehension: verbalized  understanding   HOME EXERCISE PROGRAM:  [id number]  ASSESSMENT:  CLINICAL IMPRESSION: Pt enters stating he just feels sore, no "real pain". Says his shoulder is feeling fine. States he did a lot of yardwork over the weekend with minimal pain. Focus today on general strengthening. Pt requires constant cues for upright posture and to slow pace during eccentric phase. Says he hasn't really been doing his HEP due to being busy. Would benefit from additional strengthening and HEP review.    OBJECTIVE IMPAIRMENTS decreased coordination, decreased endurance, decreased ROM, decreased strength, increased muscle spasms, and pain.   ACTIVITY LIMITATIONS carrying, lifting, bending, reach over head, and locomotion level  PARTICIPATION LIMITATIONS: yard work  [place] Age and Past/current experiences are also affecting patient's functional outcome.   REHAB POTENTIAL: Good  CLINICAL DECISION MAKING: Stable/uncomplicated  EVALUATION COMPLEXITY: Low   GOALS: Goals reviewed with patient? Yes  SHORT TERM GOALS: Target date: 04/07/2022  I with initial hEP Baseline: Goal status: met 03/17/22  LONG TERM GOALS: Target date: 05/05/22  I with final HEP Baseline:  Goal status: INITIAL  2.  Achieve at least 69 on FOTO Baseline: 60 Goal status: INITIAL  3.  Patient will report improved tolerance of his yardwork, at least 2 hours with pain/tingling < 3/5 Baseline: Reports pain with yardwork, up to 5/10 Goal status: Progressing  4.  Patient will improve 5 x STS to < 10 sec Baseline: 13 Goal status: met 03/17/22  5.  Patient will report improved functional use of R UE without pain. Baseline: up to 5/10 Goal status: Progressing      PLAN: PT FREQUENCY: 2x/week  PT DURATION: 10 weeks  PLANNED INTERVENTIONS: Therapeutic exercises, Therapeutic activity, Neuromuscular re-education, Balance training, Gait training, Patient/Family education, Joint mobilization, Dry Needling, Electrical  stimulation, Moist heat, Taping, Ionotophoresis 64m/ml Dexamethasone, and Manual therapy.  PLAN FOR NEXT SESSION: progress func strength and stretching  Keidan Aumiller, SPTA 03/20/2022, 8:03 AM

## 2022-03-23 ENCOUNTER — Ambulatory Visit: Payer: Medicare Other | Admitting: Physical Therapy

## 2022-03-23 DIAGNOSIS — M25652 Stiffness of left hip, not elsewhere classified: Secondary | ICD-10-CM | POA: Diagnosis not present

## 2022-03-23 DIAGNOSIS — M5459 Other low back pain: Secondary | ICD-10-CM

## 2022-03-23 NOTE — Therapy (Signed)
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   Past Surgical History:  Procedure Laterality Date   Biopsy of Finger  05/20/12   Left 4th finger   Cardia Stents  2003 0r 2004   Patient Active Problem List   Diagnosis Date Noted   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 07/10/2012    PCP: Charleston Poot  REFERRING PROVIDER: Ignacia Bayley, PA-C   REFERRING DIAG: Diagnosis [id number] (ICD-10-CM) - Spinal stenosis, lumbar region with neurogenic claudication   Rationale for Evaluation and Treatment Rehabilitation  THERAPY DIAG:  Other low back pain  ONSET DATE: 11/14/21  SUBJECTIVE:                                                                                                                                                                                           SUBJECTIVE STATEMENT:  "I am just washed out this morning" Reports no pain  PERTINENT HISTORY:  Patient underwent L3-4, L45 laminectomy on 11/14/21.  PAIN:  Are you having pain? NO   PRECAUTIONS: None  WEIGHT BEARING RESTRICTIONS No  FALLS:  Has patient fallen in last 6 months? No  LIVING ENVIRONMENT: Lives with: lives alone Lives in: House/apartment   OCCUPATION: retired-does yard work  PLOF: Independent  PATIENT GOALS Decreased pain   OBJECTIVE:    PATIENT SURVEYS:  FOTO 60.8  MUSCLE LENGTH: Patient with tightness in B HS and hip flexors  POSTURE: forward head  PALPATION: Patient reports no TTP. Generalized tightness noted  in lumbar paraspinals and lower trunk/hip musculature.  LUMBAR ROM:   Active  A/PROM  eval  Flexion Mid shin  Extension WNL  Right lateral flexion 3" > knee  Left lateral flexion 2" > knee  Right rotation < 1/4  Left rotation <1/4   (Blank rows = not tested)  LOWER EXTREMITY ROM:   Generalized tightness throughout hips  LOWER EXTREMITY MMT:  4+/5 throughout  FUNCTIONAL TESTS:  5 times sit to stand: 13.15 Timed up and go (TUG): 12.56  03/17/22 5 x  STS: 11.33 TUG: no AD 9.32 sec  TODAY'S TREATMENT   03/23/22 Nustep L 5 6 min UBE 2 min fwd/2 min back Resisited Gait 4 way 20# 4 x each Cable pulley shld ext 10 # 2 sets 10 STS 2 sets10 with wt ball            Lat Pull 25# 2 sets 10 Black Tband trunk ext 2 sets 10 Feet on ball bridge,KTC and obl 20 x PROM/strecthing LE and trunk      03/20/22 UBE L2 x 6 min Lat pull down/seated rows 25# 2x10 Standing shoulder extension/rows 10# 2x10 Reistsed gait 4 way 20# x 4 each Leg extension 10# 2x10 HS curls 20# 2x10 Feet on ball KTC and bridges 1x10   03/17/22  Nustep L 4 6  min HS curl 20# 2 sets 10 Knee ext 10 # 2 sets 10 10# cable pulleys shld ext and row 12 each Black tband trunk ext 2 sets 10 Lat pull 20# 2 sets 10 Leg Press 40# 3 sets 10 STS with wt ball press 10 x Supine feet on ball bridge and trunk rotation 2 sets 10 each BIL LE and trunk PROM/stretch Postural cuing needed through out as he tends to hold head down and has rounded shlds        03/10/22 Hawkins test (+)-R STM to R pects with stretch, scapular mobs R Supine LE stretch-HS and SKTC-Patient reported increased neck strain, so deferred further supine stretching Supine bridge with G theraband abd resistance, SLR, 10 reps each Standing sh ext, rows, ER with G tband 10 each   02/24/22 Education, ROM   PATIENT EDUCATION:  Education details: None Person educated: Patient Education method: Explanation Education comprehension: verbalized  understanding   HOME EXERCISE PROGRAM:  Encouraged doing daily ASSESSMENT:  CLINICAL IMPRESSION: Pt enters feeling worn out but participated well with ex with improved posture and less cuing needed.BIL LE tightness RT > Left with c/o cramping at night. OBJECTIVE IMPAIRMENTS decreased coordination, decreased endurance, decreased ROM, decreased strength, increased muscle spasms, and pain.   ACTIVITY LIMITATIONS carrying, lifting, bending, reach over head, and locomotion level  PARTICIPATION LIMITATIONS: yard work  [place] Age and Past/current experiences are also affecting patient's functional outcome.   REHAB POTENTIAL: Good  CLINICAL DECISION MAKING: Stable/uncomplicated  EVALUATION COMPLEXITY: Low   GOALS: Goals reviewed with patient? Yes  SHORT TERM GOALS: Target date: 04/07/2022  I with initial hEP Baseline: Goal status: met 03/17/22  LONG TERM GOALS: Target date: 05/05/22  I with final HEP Baseline:  Goal status: evolving  2.  Achieve at least 69 on FOTO Baseline: 60 Goal status: INITIAL  3.  Patient will report improved tolerance of his yardwork, at least 2 hours with pain/tingling < 3/5 Baseline: Reports pain with yardwork, up to 5/10 Goal status: Progressing  4.  Patient will improve 5 x STS to < 10 sec Baseline: 13 Goal status: met 03/17/22  5.  Patient will report improved functional use of R UE without pain. Baseline: up to 5/10 Goal status: Progressing      PLAN: PT FREQUENCY: 2x/week  PT DURATION: 10 weeks  PLANNED INTERVENTIONS: Therapeutic exercises, Therapeutic activity, Neuromuscular re-education, Balance training, Gait training, Patient/Family education, Joint mobilization, Dry Needling, Electrical stimulation, Moist heat, Taping, Ionotophoresis 1m/ml Dexamethasone, and Manual therapy.  PLAN FOR NEXT SESSION: progress func strength and stretching   Patient Details  Name: Jon SAYEGHMRN: [id number] of Birth:  1Aug 07, 1933Referring Provider:  RCharleston Poot MD  Encounter Date: 03/23/2022   Vickii Penna 03/23/2022, 7:57 AM  [place] [place], [place], [address] Phone: [phone]   Fax:  [phone]

## 2022-03-28 ENCOUNTER — Ambulatory Visit: Payer: Medicare Other | Attending: Physician Assistant | Admitting: Physical Therapy

## 2022-03-28 DIAGNOSIS — M79605 Pain in left leg: Secondary | ICD-10-CM | POA: Insufficient documentation

## 2022-03-28 DIAGNOSIS — R2689 Other abnormalities of gait and mobility: Secondary | ICD-10-CM | POA: Diagnosis present

## 2022-03-28 DIAGNOSIS — M25651 Stiffness of right hip, not elsewhere classified: Secondary | ICD-10-CM | POA: Insufficient documentation

## 2022-03-28 DIAGNOSIS — M5459 Other low back pain: Secondary | ICD-10-CM | POA: Diagnosis present

## 2022-03-28 DIAGNOSIS — M25652 Stiffness of left hip, not elsewhere classified: Secondary | ICD-10-CM | POA: Insufficient documentation

## 2022-03-28 NOTE — Therapy (Signed)
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   Past Surgical History:  Procedure Laterality Date   Biopsy of Finger  05/20/12   Left 4th finger   Cardia Stents  2003 0r 2004   Patient Active Problem List   Diagnosis Date Noted   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 07/10/2012    PCP: Charleston Poot  REFERRING PROVIDER: Ignacia Bayley, PA-C   REFERRING DIAG: Diagnosis [phone] (ICD-10-CM) - Spinal stenosis, lumbar region with neurogenic claudication   Rationale for Evaluation and Treatment Rehabilitation  THERAPY DIAG:  Other low back pain  Other abnormalities of gait and mobility  ONSET DATE: 11/14/21  SUBJECTIVE:                                                                                                                                                                                           SUBJECTIVE STATEMENT:  I think some better, still LE fatigue  PERTINENT HISTORY:  Patient underwent L3-4, L45 laminectomy on 11/14/21.  PAIN:  Are you having pain? NO   PRECAUTIONS: None  WEIGHT BEARING RESTRICTIONS No  FALLS:  Has patient fallen in last 6 months? No  LIVING ENVIRONMENT: Lives with: lives alone Lives in: House/apartment   OCCUPATION: retired-does yard work  PLOF: Independent  PATIENT GOALS Decreased pain   OBJECTIVE:    PATIENT SURVEYS:  FOTO 60.8  MUSCLE LENGTH: Patient with tightness in B HS and hip flexors  POSTURE: forward head  PALPATION: Patient reports no TTP.  Generalized tightness noted in lumbar paraspinals and lower trunk/hip musculature.  LUMBAR ROM:   Active  A/PROM  eval  Flexion Mid shin  Extension WNL  Right lateral flexion 3" > knee  Left lateral flexion 2" > knee  Right rotation < 1/4  Left rotation <1/4   (Blank rows = not tested)  LOWER EXTREMITY ROM:   Generalized tightness throughout hips  LOWER EXTREMITY MMT:  4+/5 throughout  FUNCTIONAL TESTS:  5 times sit to stand: 13.15 Timed up and go (TUG): 12.56  03/17/22 5 x STS: 11.33 TUG: no AD 9.32 sec  TODAY'S TREATMENT   03/28/22  Nustep L 5 7 min Lat Pull and seated rows 25# 2 sets 12 Black Tband back ext 2 sets 12 20# upright row 2 sets 10 STS wt ball chest press 10 x, 10 x with OH press Standing AR press and trunk rotation 10 each Leg Press 30# 2 sets 12 6 in step up opp leg ext 10 x BIL UE support , then lateral step up with opp hip abd 10 x Feet on Ball bridge, KTC and obl 15 x each Isometric abdominals 15 x PROM LE and Trunk   03/23/22 Nustep L 5 6 min UBE 2 min fwd/2 min back Resisited Gait 4 way 20# 4 x each Cable pulley shld ext 10 # 2 sets 10 STS 2 sets10 with wt ball            Lat Pull 25# 2 sets 10 Black Tband trunk ext 2 sets 10 Feet on ball bridge,KTC and obl 20 x PROM/strecthing LE and trunk      03/20/22 UBE L2 x 6 min Lat pull down/seated rows 25# 2x10 Standing shoulder extension/rows 10# 2x10 Reistsed gait 4 way 20# x 4 each Leg extension 10# 2x10 HS curls 20# 2x10 Feet on ball KTC and bridges 1x10   03/17/22  Nustep L 4 6  min HS curl 20# 2 sets 10 Knee ext 10 # 2 sets 10 10# cable pulleys shld ext and row 12 each Black tband trunk ext 2 sets 10 Lat pull 20# 2 sets 10 Leg Press 40# 3 sets 10 STS with wt ball press 10 x Supine feet on ball bridge and trunk rotation 2 sets 10 each BIL LE and trunk PROM/stretch Postural cuing needed through out as he tends to hold head down and has rounded  shlds        03/10/22 Hawkins test (+)-R STM to R pects with stretch, scapular mobs R Supine LE stretch-HS and SKTC-Patient reported increased neck strain, so deferred further supine stretching Supine bridge with G theraband abd resistance, SLR, 10 reps each Standing sh ext, rows, ER with G tband 10 each   02/24/22 Education, ROM   PATIENT EDUCATION:  Education details: None Person educated: Patient Education method: Explanation Education comprehension: verbalized understanding   HOME EXERCISE PROGRAM:  Encouraged doing daily ASSESSMENT:  CLINICAL IMPRESSION: pt arrived verb some better, no real arm complaints, " ever now and again it pulls", chief complaint is fatgue. Progressing with goals. Progressed ex and cued to actviate core with ex. OBJECTIVE IMPAIRMENTS decreased coordination, decreased endurance, decreased ROM, decreased strength, increased muscle spasms, and pain.   ACTIVITY LIMITATIONS carrying, lifting, bending, reach over head, and locomotion level  PARTICIPATION LIMITATIONS: yard work  [place] Age and Past/current experiences are also affecting patient's functional outcome.   REHAB POTENTIAL: Good  CLINICAL DECISION MAKING: Stable/uncomplicated  EVALUATION COMPLEXITY: Low   GOALS: Goals reviewed with patient? Yes  SHORT TERM GOALS: Target date: 04/07/2022  I with initial hEP Baseline: Goal status: met 03/17/22  LONG TERM GOALS: Target date: 05/05/22  I with final HEP Baseline:  Goal status: evolving  2.  Achieve at least 69 on FOTO Baseline: 60 Goal status: INITIAL  3.  Patient will report improved tolerance of his yardwork, at least 2 hours with pain/tingling < 3/5 Baseline: Reports pain with yardwork, up to 5/10 Goal status: Progressing 03/28/22  4.  Patient will improve 5 x STS  to < 10 sec Baseline: 13 Goal status: met 03/17/22  5.  Patient will report improved functional use of R UE without pain. Baseline: up to 5/10 Goal  status: 03/28/22 met     PLAN: PT FREQUENCY: 2x/week  PT DURATION: 10 weeks  PLANNED INTERVENTIONS: Therapeutic exercises, Therapeutic activity, Neuromuscular re-education, Balance training, Gait training, Patient/Family education, Joint mobilization, Dry Needling, Electrical stimulation, Moist heat, Taping, Ionotophoresis 26m/ml Dexamethasone, and Manual therapy.  PLAN FOR NEXT SESSION: progress func strength and stretching   Patient Details  Name: Jon WIDENMRN: [id number] of Birth: [birth year]Referring Provider:  RCharleston Poot MD  Encounter Date: 03/28/2022   PLaqueta Carina PTA 03/28/2022, 7:52 AM  [place] [address]Phone: [phone]  Fax:  [address] [address]Phone: [phone]  Fax:  [phone] Patient Details  Name: Jon MEYERMRN: [id number] of Birth: 108-17-1933Referring Provider:  RCharleston Poot MD  Encounter Date: 03/28/2022   PLaqueta Carina PTA 03/28/2022, 7:52 AM  [place] [address]Phone: [phone]  Fax:  [phone]

## 2022-03-31 ENCOUNTER — Ambulatory Visit: Payer: Medicare Other | Admitting: Physical Therapy

## 2022-03-31 ENCOUNTER — Encounter: Payer: Self-pay | Admitting: Physical Therapy

## 2022-03-31 DIAGNOSIS — M79605 Pain in left leg: Secondary | ICD-10-CM

## 2022-03-31 DIAGNOSIS — M25652 Stiffness of left hip, not elsewhere classified: Secondary | ICD-10-CM

## 2022-03-31 DIAGNOSIS — R2689 Other abnormalities of gait and mobility: Secondary | ICD-10-CM

## 2022-03-31 DIAGNOSIS — M25651 Stiffness of right hip, not elsewhere classified: Secondary | ICD-10-CM

## 2022-03-31 DIAGNOSIS — M5459 Other low back pain: Secondary | ICD-10-CM

## 2022-03-31 NOTE — Therapy (Signed)
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   Past Surgical History:  Procedure Laterality Date   Biopsy of Finger  05/20/12   Left 4th finger   Cardia Stents  2003 0r 2004   Patient Active Problem List   Diagnosis Date Noted   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 07/10/2012    PCP: Charleston Poot  REFERRING PROVIDER: Ignacia Bayley, PA-C   REFERRING DIAG: Diagnosis [phone] (ICD-10-CM) - Spinal stenosis, lumbar region with neurogenic claudication   Rationale for Evaluation and Treatment Rehabilitation  THERAPY DIAG:  Other low back pain  Other abnormalities of gait and mobility  Stiffness of left hip, not elsewhere classified  Pain in left leg  Stiffness of right hip, not elsewhere classified  ONSET DATE: 11/14/21  SUBJECTIVE:                                                                                                                                                                                           SUBJECTIVE STATEMENT:  I think some better, LE fatigue is his biggest limiting factor. A little sore after working in his garden 2 days ago.  PERTINENT HISTORY:  Patient underwent L3-4, L45 laminectomy on 11/14/21.  PAIN:  Are you having pain? NO   PRECAUTIONS: None  WEIGHT BEARING RESTRICTIONS No  FALLS:  Has patient fallen in last 6 months? No  LIVING  ENVIRONMENT: Lives with: lives alone Lives in: House/apartment   OCCUPATION: retired-does yard work  PLOF: Independent  PATIENT GOALS Decreased pain   OBJECTIVE:    PATIENT SURVEYS:  FOTO 60.8  MUSCLE LENGTH: Patient with tightness in B HS and hip flexors  POSTURE: forward head  PALPATION: Patient reports no TTP. Generalized tightness noted in lumbar paraspinals and lower trunk/hip musculature.  LUMBAR ROM:   Active  A/PROM  eval  Flexion Mid shin  Extension WNL  Right lateral flexion 3" > knee  Left lateral flexion 2" > knee  Right rotation < 1/4  Left rotation <1/4   (Blank rows = not tested)  LOWER EXTREMITY ROM:   Generalized tightness throughout hips  LOWER EXTREMITY MMT:  4+/5 throughout  FUNCTIONAL TESTS:  5 times sit to stand: 13.15 Timed up and go (TUG): 12.56  03/17/22 5 x STS: 11.33 TUG: no AD 9.32 sec  TODAY'S TREATMENT 03/31/22 NuStep L5 x 22mn, then 2 x 30 sec at L6 for strength. Knees hurt so stopped. Lat Pull 2 x 10 @15 , then 1 x 10 @ 25# Seated Rows 25# 2 x 10 Trunk ext with Black Tband resistance 2 x 10 Supine Passive stretch to HS, ITB, piriformis, KTC Standing hip abd against red Tband resistance at knees 2 x 10 each STS 2 x 10, 10 with OHP and 10 with chest press, 4# ball Lateral step ups 2 x 10 each way, 4" step, BUE support   03/28/22 Nustep L 5 7 min Lat Pull and seated rows 25# 2 sets 12 Black Tband back ext 2 sets 12 20# upright row 2 sets 10 STS wt ball chest press 10 x, 10 x with OH press Standing AR press and trunk rotation 10 each Leg Press 30# 2 sets 12 6 in step up opp leg ext 10 x BIL UE support , then lateral step up with opp hip abd 10 x Feet on Ball bridge, KTC and obl 15 x each Isometric abdominals 15 x PROM LE and Trunk   03/23/22 Nustep L 5 6 min UBE 2 min fwd/2 min back Resisited Gait 4 way 20# 4 x each Cable pulley shld ext 10 # 2 sets 10 STS 2 sets10 with wt ball            Lat Pull 25# 2 sets  10 Black Tband trunk ext 2 sets 10 Feet on ball bridge,KTC and obl 20 x PROM/strecthing LE and trunk      03/20/22 UBE L2 x 6 min Lat pull down/seated rows 25# 2x10 Standing shoulder extension/rows 10# 2x10 Reistsed gait 4 way 20# x 4 each Leg extension 10# 2x10 HS curls 20# 2x10 Feet on ball KTC and bridges 1x10   03/17/22  Nustep L 4 6  min HS curl 20# 2 sets 10 Knee ext 10 # 2 sets 10 10# cable pulleys shld ext and row 12 each Black tband trunk ext 2 sets 10 Lat pull 20# 2 sets 10 Leg Press 40# 3 sets 10 STS with wt ball press 10 x Supine feet on ball bridge and trunk rotation 2 sets 10 each BIL LE and trunk PROM/stretch Postural cuing needed through out as he tends to hold head down and has rounded shlds        03/10/22 Hawkins test (+)-R STM to R pects with stretch, scapular mobs R Supine LE stretch-HS and SKTC-Patient reported increased neck strain, so deferred further supine stretching Supine bridge with G theraband abd resistance, SLR, 10 reps each Standing sh ext, rows, ER with G tband 10 each   02/24/22 Education, ROM   PATIENT EDUCATION:  Education details: None Person educated: Patient Education method: Explanation Education comprehension: verbalized understanding   HOME EXERCISE PROGRAM:  Encouraged doing daily ASSESSMENT:  CLINICAL IMPRESSION: Patient reports some overall soreness after working in his garden. Tolerated active progression of strengthening and stretch with C/O mild B knee pain, but it did not limit him.  OBJECTIVE IMPAIRMENTS decreased coordination, decreased endurance, decreased ROM, decreased strength, increased  muscle spasms, and pain.   ACTIVITY LIMITATIONS carrying, lifting, bending, reach over head, and locomotion level  PARTICIPATION LIMITATIONS: yard work  [place] Age and Past/current experiences are also affecting patient's functional outcome.   REHAB POTENTIAL: Good  CLINICAL DECISION MAKING:  Stable/uncomplicated  EVALUATION COMPLEXITY: Low   GOALS: Goals reviewed with patient? Yes  SHORT TERM GOALS: Target date: 04/07/2022  I with initial hEP Baseline: Goal status: met 03/17/22  LONG TERM GOALS: Target date: 05/05/22  I with final HEP Baseline:  Goal status: evolving  2.  Achieve at least 69 on FOTO Baseline: 60 Goal status: INITIAL  3.  Patient will report improved tolerance of his yardwork, at least 2 hours with pain/tingling < 3/5 Baseline: Reports pain with yardwork, up to 5/10 Goal status: Progressing 03/28/22  4.  Patient will improve 5 x STS to < 10 sec Baseline: 13 Goal status: met 03/17/22  5.  Patient will report improved functional use of R UE without pain. Baseline: up to 5/10 Goal status: 03/28/22 met     PLAN: PT FREQUENCY: 2x/week  PT DURATION: 10 weeks  PLANNED INTERVENTIONS: Therapeutic exercises, Therapeutic activity, Neuromuscular re-education, Balance training, Gait training, Patient/Family education, Joint mobilization, Dry Needling, Electrical stimulation, Moist heat, Taping, Ionotophoresis 83m/ml Dexamethasone, and Manual therapy.  PLAN FOR NEXT SESSION: progress func strength and stretching   Patient Details  Name: TROQUE SCHILLMRN: [id number] of Birth: [birth year]Referring Provider:  RCharleston Poot MD  Encounter Date: 03/31/2022   SMarcelina Morel DPT 03/31/2022, 8:37 AM  [place] [address]Phone: [phone]  Fax:  [phone]

## 2022-04-06 ENCOUNTER — Ambulatory Visit: Payer: Medicare Other | Admitting: Physical Therapy

## 2022-04-06 DIAGNOSIS — R2689 Other abnormalities of gait and mobility: Secondary | ICD-10-CM

## 2022-04-06 DIAGNOSIS — M5459 Other low back pain: Secondary | ICD-10-CM | POA: Diagnosis not present

## 2022-04-06 NOTE — Therapy (Signed)
OUTPATIENT PHYSICAL THERAPY THORACOLUMBAR   Patient Name: Jon Lam MRN: [id number] DOB:05/16/1932, 89 y.o., male Today's Date: 04/06/2022   PT End of Session - 04/06/22 0756     Visit Number 8    Date for PT Re-Evaluation 05/05/22    PT Start Time 0755    PT Stop Time 0842    PT Time Calculation (min) 47 min              Past Medical History:  Diagnosis Date   History of heart attack 1993   Hypertension    Squamous cell carcinoma 05/20/12   Left fourth /finger with Actinic Keratoses   Past Surgical History:  Procedure Laterality Date   Biopsy of Finger  05/20/12   Left 4th finger   Cardia Stents  2003 0r 2004   Patient Active Problem List   Diagnosis Date Noted   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 07/10/2012    PCP: Ruehle,  Stephen  REFERRING PROVIDER: Avila-Duran, Carolyn, PA-C   REFERRING DIAG: Diagnosis M48.062 (ICD-10-CM) - Spinal stenosis, lumbar region with neurogenic claudication   Rationale for Evaluation and Treatment Rehabilitation  THERAPY DIAG:  Other low back pain  Other abnormalities of gait and mobility  ONSET DATE: 11/14/21  SUBJECTIVE:                                                                                                                                                                                           SUBJECTIVE STATEMENT:  I think some better, LE and back fatigue is his biggest  issue. I was getting better but then had vascular study Monday with cuffs and it has made me so tired. Sometimes when I walk my legs just give off and I have to force myself to keep going or sit down.  PERTINENT HISTORY:  Patient underwent L3-4, L45 laminectomy on 11/14/21.  PAIN:  Are you having pain? NO   PRECAUTIONS: None  WEIGHT BEARING RESTRICTIONS No  FALLS:  Has patient fallen in last 6 months? 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 Generalized tightness noted in lumbar paraspinals and lower trunk/hip musculature.  LUMBAR ROM:   Active  A/PROM  eval  Flexion Mid shin  Extension WNL  Right lateral flexion 3" > knee  Left lateral flexion 2" > knee  Right   rotation < 1/4  Left rotation <1/4   (Blank rows = not tested)  LOWER EXTREMITY ROM:   Generalized tightness throughout hips  LOWER EXTREMITY MMT:  4+/5 throughout  FUNCTIONAL TESTS:  5 times sit to stand: 13.15 Timed up and go (TUG): 12.56  03/17/22 5 x STS: 11.33 TUG: no AD 9.32 sec  TODAY'S TREATMENT  04/06/22  Nustep L 5 37mn  Resisted Gait fwd and back 5 x each and then laterally 3 x each 30# 6 inch step up fwd with opp leg ext 10 each, UE support 6 inch lateral step with opp leg abd 10 each , UE support STS with wt ball press 10 x then 10 x with OH press Standing OH ext 10 then rotation 10 each way Seated row 25# 2 sets 10 Lat pull down 25# 2 sets 10 Black tband trunk ext and flex 2 sets 10 PROM LE and trunk    03/31/22 NuStep L5 x 660m, then 2 x 30 sec at L6 for strength. Knees hurt so stopped. Lat Pull 2 x 10 _0 , then 1 x 10 @ 25# Seated Rows 25# 2 x 10 Trunk ext with Black Tband resistance 2 x 10 Supine Passive stretch to HS, ITB, piriformis, KTC Standing hip abd against red Tband resistance at knees 2 x 10 each STS 2 x 10, 10 with OHP and 10 with chest press, 4# ball Lateral step ups 2 x 10 each way, 4" step, BUE support   03/28/22 Nustep L 5 7 min Lat Pull and seated rows 25# 2 sets 12 Black Tband back ext 2 sets 12 20# upright row 2 sets 10 STS wt ball chest press 10 x, 10 x with OH press Standing AR press and trunk rotation 10 each Leg Press 30# 2 sets 12 6 in step up opp leg ext 10 x BIL UE support , then  lateral step up with opp hip abd 10 x Feet on Ball bridge, KTC and obl 15 x each Isometric abdominals 15 x PROM LE and Trunk   03/23/22 Nustep L 5 6 min UBE 2 min fwd/2 min back Resisited Gait 4 way 20# 4 x each Cable pulley shld ext 10 # 2 sets 10 STS 2 sets10 with wt ball            Lat Pull 25# 2 sets 10 Black Tband trunk ext 2 sets 10 Feet on ball bridge,KTC and obl 20 x PROM/strecthing LE and trunk      03/20/22 UBE L2 x 6 min Lat pull down/seated rows 25# 2x10 Standing shoulder extension/rows 10# 2x10 Reistsed gait 4 way 20# x 4 each Leg extension 10# 2x10 HS curls 20# 2x10 Feet on ball KTC and bridges 1x10   03/17/22  Nustep L 4 6  min HS curl 20# 2 sets 10 Knee ext 10 # 2 sets 10 10# cable pulleys shld ext and row 12 each Black tband trunk ext 2 sets 10 Lat pull 20# 2 sets 10 Leg Press 40# 3 sets 10 STS with wt ball press 10 x Supine feet on ball bridge and trunk rotation 2 sets 10 each BIL LE and trunk PROM/stretch Postural cuing needed through out as he tends to hold head down and has rounded shlds        03/10/22 Hawkins test (+)-R STM to R pects with stretch, scapular mobs R Supine LE stretch-HS and SKTC-Patient reported increased neck strain, so deferred further supine stretching Supine bridge with G theraband abd  resistance, SLR, 10 reps each Standing sh ext, rows, ER with G tband 10 each   02/24/22 Education, ROM   PATIENT EDUCATION:  Education details: None Person educated: Patient Education method: Explanation Education comprehension: verbalized understanding   HOME EXERCISE PROGRAM:  Encouraged doing daily ASSESSMENT:  CLINICAL IMPRESSION: focus session on overall strength with emphasis on ext as pt feels weakness with prolonged activities. Progressing with goals  OBJECTIVE IMPAIRMENTS decreased coordination, decreased endurance, decreased ROM, decreased strength, increased muscle spasms, and pain.   ACTIVITY LIMITATIONS  carrying, lifting, bending, reach over head, and locomotion level  PARTICIPATION LIMITATIONS: yard work  [place] Age and Past/current experiences are also affecting patient's functional outcome.   REHAB POTENTIAL: Good  CLINICAL DECISION MAKING: Stable/uncomplicated  EVALUATION COMPLEXITY: Low   GOALS: Goals reviewed with patient? Yes  SHORT TERM GOALS: Target date: 04/07/2022  I with initial hEP Baseline: Goal status: met 03/17/22  LONG TERM GOALS: Target date: 05/05/22  I with final HEP Baseline:  Goal status: evolving  2.  Achieve at least 69 on FOTO Baseline: 60 Goal status: INITIAL  3.  Patient will report improved tolerance of his yardwork, at least 2 hours with pain/tingling < 3/5 Baseline: Reports pain with yardwork, up to 5/10 Goal status: Progressing 04/06/22  4.  Patient will improve 5 x STS to < 10 sec Baseline: 13 Goal status: met 03/17/22  5.  Patient will report improved functional use of R UE without pain. Baseline: up to 5/10 Goal status: 03/28/22 met     PLAN: PT FREQUENCY: 2x/week  PT DURATION: 10 weeks  PLANNED INTERVENTIONS: Therapeutic exercises, Therapeutic activity, Neuromuscular re-education, Balance training, Gait training, Patient/Family education, Joint mobilization, Dry Needling, Electrical stimulation, Moist heat, Taping, Ionotophoresis 30m/ml Dexamethasone, and Manual therapy.  PLAN FOR NEXT SESSION: progress func strength and stretching   Patient Details  Name: Jon WASCOMRN: [id number] of Birth: 109-06-1933Referring Provider:  RCharleston Poot MD  Encounter Date: 04/06/2022   AFranki MontePTA  04/06/2022, 7:57 AM  [place] [address]Phone: [phone]  Fax:  3[place] [address]Phone: [phone]  Fax:   [phone] Patient Details  Name: Jon EVINGERMRN: [id number] of Birth: 119-Sep-1933Referring Provider:  RCharleston Poot MD  Encounter Date: 04/06/2022   PLaqueta Carina PTA 04/06/2022, 7:57 AM  [place] [address]Phone: [phone]  Fax:  [phone]

## 2022-04-11 ENCOUNTER — Ambulatory Visit: Payer: Medicare Other | Admitting: Physical Therapy

## 2022-04-13 ENCOUNTER — Ambulatory Visit: Payer: Medicare Other | Admitting: Physical Therapy

## 2022-04-14 ENCOUNTER — Ambulatory Visit: Payer: Medicare Other | Admitting: Physical Therapy

## 2022-04-14 DIAGNOSIS — M5459 Other low back pain: Secondary | ICD-10-CM | POA: Diagnosis not present

## 2022-04-14 DIAGNOSIS — R2689 Other abnormalities of gait and mobility: Secondary | ICD-10-CM

## 2022-04-14 NOTE — Therapy (Signed)
OUTPATIENT PHYSICAL THERAPY THORACOLUMBAR   Patient Name: Jon Lam MRN: [id number] DOB:10/14/1931, 88 y.o., male Today's Date: 04/14/2022   PT End of Session - 04/14/22 0755     Visit Number 9    Date for PT Re-Evaluation 05/05/22    PT Start Time 0755    PT Stop Time 0840    PT Time Calculation (min) 45 min              Past Medical History:  Diagnosis Date   History of heart attack 1993   Hypertension    Squamous cell carcinoma 05/20/12   Left fourth Garth Bigness with Actinic Keratoses   Past Surgical History:  Procedure Laterality Date   Biopsy of Finger  05/20/12   Left 4th finger   Cardia Stents  2003 0r 2004   Patient Active Problem List   Diagnosis Date Noted   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 07/10/2012    PCP: Charleston Poot  REFERRING PROVIDER: Ignacia Bayley, PA-C   REFERRING DIAG: Diagnosis [phone] (ICD-10-CM) - Spinal stenosis, lumbar region with neurogenic claudication   Rationale for Evaluation and Treatment Rehabilitation  THERAPY DIAG:  Other low back pain  Other abnormalities of gait and mobility  ONSET DATE: 11/14/21  SUBJECTIVE:                                                                                                                                                                                           SUBJECTIVE STATEMENT: awful sinus infection I have been dealing with, still not great. I told you had that vascular test well suppose to call surgeon seems like might be blockage in RT leg causing back pain  PERTINENT HISTORY:  Patient underwent L3-4, L45 laminectomy on 11/14/21.  PAIN:  Are you having pain? NO   PRECAUTIONS: None  WEIGHT BEARING RESTRICTIONS No  FALLS:  Has patient fallen in last 6 months? 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 Generalized tightness noted in lumbar paraspinals and lower trunk/hip musculature.  LUMBAR ROM:   Active  A/PROM  eval  Flexion Mid shin  Extension WNL  Right lateral flexion 3" > knee  Left lateral flexion 2" > knee  Right rotation < 1/4  Left rotation <1/4   (Blank rows = not tested)  LOWER EXTREMITY ROM:  Generalized tightness throughout hips  LOWER EXTREMITY MMT:  4+/5 throughout  FUNCTIONAL TESTS:  5 times sit to stand: 13.15 Timed up and go (TUG): 12.56  03/17/22 5 x STS: 11.33 TUG: no AD 9.32 sec  TODAY'S TREATMENT  04/14/22  Nustep L 5 875mn Lat Pull and Seated Row 25# 2 sets 10 Black tband trunk ext and flex 2 sets 10 10# cable pulley shld ext and row with cues for core engagement 2 sets 10 each Upright row into extension 2 sets 10 15# STS wt ball 10x with chest press, 10 x with OH press Green tbnad trunk rotation 15 x each PROM and stretching BIl HS, IT, pirformis and trunk    04/06/22  Nustep L 5 674m  Resisted Gait fwd and back 5 x each and then laterally 3 x each 30# 6 inch step up fwd with opp leg ext 10 each, UE support 6 inch lateral step with opp leg abd 10 each , UE support STS with wt ball press 10 x then 10 x with OH press Standing OH ext 10 then rotation 10 each way Seated row 25# 2 sets 10 Lat pull down 25# 2 sets 10 Black tband trunk ext and flex 2 sets 10 PROM LE and trunk    03/31/22 NuStep L5 x 75m18m then 2 x 30 sec at L6 for strength. Knees hurt so stopped. Lat Pull 2 x 10 @15 , then 1 x 10 @ 25# Seated Rows 25# 2 x 10 Trunk ext with Black Tband resistance 2 x 10 Supine Passive stretch to HS, ITB, piriformis, KTC Standing hip abd against red Tband resistance at knees 2 x 10 each STS 2 x 10, 10 with OHP and 10 with chest press, 4# ball Lateral step ups 2 x 10 each way, 4" step, BUE  support   03/28/22 Nustep L 5 7 min Lat Pull and seated rows 25# 2 sets 12 Black Tband back ext 2 sets 12 20# upright row 2 sets 10 STS wt ball chest press 10 x, 10 x with OH press Standing AR press and trunk rotation 10 each Leg Press 30# 2 sets 12 6 in step up opp leg ext 10 x BIL UE support , then lateral step up with opp hip abd 10 x Feet on Ball bridge, KTC and obl 15 x each Isometric abdominals 15 x PROM LE and Trunk   03/23/22 Nustep L 5 6 min UBE 2 min fwd/2 min back Resisited Gait 4 way 20# 4 x each Cable pulley shld ext 10 # 2 sets 10 STS 2 sets10 with wt ball            Lat Pull 25# 2 sets 10 Black Tband trunk ext 2 sets 10 Feet on ball bridge,KTC and obl 20 x PROM/strecthing LE and trunk      03/20/22 UBE L2 x 6 min Lat pull down/seated rows 25# 2x10 Standing shoulder extension/rows 10# 2x10 Reistsed gait 4 way 20# x 4 each Leg extension 10# 2x10 HS curls 20# 2x10 Feet on ball KTC and bridges 1x10   03/17/22  Nustep L 4 6  min HS curl 20# 2 sets 10 Knee ext 10 # 2 sets 10 10# cable pulleys shld ext and row 12 each Black tband trunk ext 2 sets 10 Lat pull 20# 2 sets 10 Leg Press 40# 3 sets 10 STS with wt ball press 10 x Supine feet on ball bridge and trunk rotation 2 sets 10 each BIL LE  and trunk PROM/stretch Postural cuing needed through out as he tends to hold head down and has rounded shlds        03/10/22 Hawkins test (+)-R STM to R pects with stretch, scapular mobs R Supine LE stretch-HS and SKTC-Patient reported increased neck strain, so deferred further supine stretching Supine bridge with G theraband abd resistance, SLR, 10 reps each Standing sh ext, rows, ER with G tband 10 each   02/24/22 Education, ROM   PATIENT EDUCATION:  Education details: None Person educated: Patient Education method: Explanation Education comprehension: verbalized understanding   HOME EXERCISE PROGRAM:  Encouraged doing  daily ASSESSMENT:  CLINICAL IMPRESSION: pt states he feels like PT is helping. Can do more and go longer with less fatigue/pain. Progressed core stab ex with cuing. Progressing with goals OBJECTIVE IMPAIRMENTS decreased coordination, decreased endurance, decreased ROM, decreased strength, increased muscle spasms, and pain.   ACTIVITY LIMITATIONS carrying, lifting, bending, reach over head, and locomotion level  PARTICIPATION LIMITATIONS: yard work  [place] Age and Past/current experiences are also affecting patient's functional outcome.   REHAB POTENTIAL: Good  CLINICAL DECISION MAKING: Stable/uncomplicated  EVALUATION COMPLEXITY: Low   GOALS: Goals reviewed with patient? Yes  SHORT TERM GOALS: Target date: 04/07/2022  I with initial hEP Baseline: Goal status: met 03/17/22  LONG TERM GOALS: Target date: 05/05/22  I with final HEP Baseline:  Goal status: evolving  2.  Achieve at least 69 on FOTO Baseline: 60 Goal status: INITIAL  3.  Patient will report improved tolerance of his yardwork, at least 2 hours with pain/tingling < 3/5 Baseline: Reports pain with yardwork, up to 5/10 Goal status: Progressing 04/06/22  4.  Patient will improve 5 x STS to < 10 sec Baseline: 13 Goal status: met 03/17/22  5.  Patient will report improved functional use of R UE without pain. Baseline: up to 5/10 Goal status: 03/28/22 met     PLAN: PT FREQUENCY: 2x/week  PT DURATION: 10 weeks  PLANNED INTERVENTIONS: Therapeutic exercises, Therapeutic activity, Neuromuscular re-education, Balance training, Gait training, Patient/Family education, Joint mobilization, Dry Needling, Electrical stimulation, Moist heat, Taping, Ionotophoresis 43m/ml Dexamethasone, and Manual therapy.  PLAN FOR NEXT SESSION: progress func strength and stretching   Patient Details  Name: TROSHUN KLINGENSMITHMRN: [id number] of Birth: 103-25-1933Referring Provider:  RCharleston Poot MD  Encounter Date:  04/14/2022   AFranki MontePTA  04/14/2022, 7:55 AM  [place] [address]Phone: [phone]  Fax:  [address] [address]Phone: [phone]  Fax:  [phone] Patient Details  Name: TJAVANTE NILSSONMRN: [id number] of Birth: [birth year]Referring Provider:  RCharleston Poot MD  Encounter Date: 04/14/2022   PLaqueta Carina PTA 04/14/2022, 7:55 AM  [place] [address]Phone: [phone]  Fax:  [address] [address]Phone: [phone]  Fax:  [phone] Patient Details  Name: TKARLIN HEILMANMRN: [id number] of Birth: 126-Dec-1933Referring Provider:  RCharleston Poot MD  Encounter Date: 04/14/2022   PLaqueta Carina PTA 04/14/2022, 7:55 AM  [place] [address]Phone: [phone]  Fax:  [phone]

## 2022-04-18 ENCOUNTER — Ambulatory Visit: Payer: Medicare Other | Admitting: Physical Therapy

## 2022-04-18 DIAGNOSIS — R2689 Other abnormalities of gait and mobility: Secondary | ICD-10-CM

## 2022-04-18 DIAGNOSIS — M5459 Other low back pain: Secondary | ICD-10-CM | POA: Diagnosis not present

## 2022-04-18 NOTE — Therapy (Signed)
OUTPATIENT PHYSICAL THERAPY THORACOLUMBAR   Patient Name: Jon Lam MRN: [id number] DOB:[birth year], 88 y.o., male Today's Date: 04/18/2022  Progress Note Reporting Period 02/24/22 to 04/18/22  See note below for Objective Data and Assessment of Progress/Goals.       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 07/10/2012    PCP: Charleston Poot  REFERRING PROVIDER: Ignacia Bayley, PA-C   REFERRING DIAG: Diagnosis [phone] (ICD-10-CM) - Spinal stenosis, lumbar region with neurogenic claudication   Rationale for Evaluation and Treatment Rehabilitation  THERAPY DIAG:  Other low back pain  Other abnormalities of gait and mobility  ONSET DATE: 11/14/21  SUBJECTIVE:                                                                                                                                                                                           SUBJECTIVE STATEMENT: felt really good after last session, pain/soreness was gone in LB. Just soreness today. Vascular MD ths week  PERTINENT HISTORY:  Patient underwent L3-4, L45 laminectomy on 11/14/21.  PAIN:  Are you having pain? LBP 3/10   PRECAUTIONS: None  WEIGHT BEARING RESTRICTIONS No  FALLS:  Has patient fallen in last 6 months? 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 Generalized tightness noted in lumbar paraspinals and lower trunk/hip musculature.  LUMBAR ROM:   Active  A/PROM  eval  Flexion Mid shin  Extension WNL  Right lateral flexion 3" > knee  Left lateral flexion 2" > knee  Right rotation < 1/4  Left rotation <1/4   (Blank rows =  not tested)  LOWER EXTREMITY ROM:   Generalized tightness throughout hips  LOWER EXTREMITY MMT:  4+/5 throughout  FUNCTIONAL TESTS:  5 times sit to stand: 13.15 Timed up and go (TUG): 12.56  03/17/22 5 x STS: 11.33 TUG: no AD 9.32 sec  TODAY'S TREATMENT  04/18/22  Nustep L 5 37mn Lat Pull and Seated Row 25# 2 sets 15 Black tband trunk ext and flex 2 sets 10 10# cable pulley shld ext and row with cues for core engagement 2 sets 10 each Upright row into extension 2 sets 10 15# STS wt ball 10x with chest press, 10 x with OH press Green tband trunk rotation 15 x each 10# AR press 15 x BIL  PROM and stretching BIl HS, IT, pirformis and trunk  04/14/22  Nustep L 5 678m Lat Pull and Seated Row 25# 2 sets 10 Black tband trunk ext and flex 2 sets 10 10# cable pulley shld ext and row with cues for core engagement 2 sets 10 each Upright row into extension 2 sets 10 15# STS wt ball 10x with chest press, 10 x with OH press Green tbnad trunk rotation 15 x each PROM and stretching BIl HS, IT, pirformis and trunk    04/06/22  Nustep L 5 16m33m Resisted Gait fwd and back 5 x each and then laterally 3 x each 30# 6 inch step up fwd with opp leg ext 10 each, UE support 6 inch lateral step with opp leg abd 10 each , UE support STS with wt ball press 10 x then 10 x with OH press Standing OH ext 10 then rotation 10 each way Seated row 25# 2 sets 10 Lat pull down 25# 2 sets 10 Black tband  trunk ext and flex 2 sets 10 PROM LE and trunk    03/31/22 NuStep L5 x 16mi70mthen 2 x 30 sec at L6 for strength. Knees hurt so stopped. Lat Pull 2 x 10 _0 , then 1 x 10 @ 25# Seated Rows 25# 2 x 10 Trunk ext with Black Tband resistance 2 x 10 Supine Passive stretch to HS, ITB, piriformis, KTC Standing hip abd against red Tband resistance at knees 2 x 10 each STS 2 x 10, 10 with OHP and 10 with chest press, 4# ball Lateral step ups 2 x 10 each way, 4" step, BUE support     03/10/22 Hawkins test (+)-R STM to R pects with stretch, scapular mobs R Supine LE stretch-HS and SKTC-Patient reported increased neck strain, so deferred further supine stretching Supine bridge with G theraband abd resistance, SLR, 10 reps each Standing sh ext, rows, ER with G tband 10 each   02/24/22 Education, ROM   PATIENT EDUCATION:  Education details: None Person educated: Patient Education method: Explanation Education comprehension: verbalized understanding   HOME EXERCISE PROGRAM:  Encouraged doing daily ASSESSMENT:  CLINICAL IMPRESSION: pt states he feels like PT is helping. Can do more and go longer with less fatigue/pain. Pnt felt last session was very helpful last time so progressed with similar session. Progressing with goals, still working to finaSunGardtill func limitations and variations of pain with yardwork. OBJECTIVE IMPAIRMENTS decreased coordination, decreased endurance, decreased ROM, decreased strength, increased muscle spasms, and pain.   ACTIVITY LIMITATIONS carrying, lifting, bending, reach over head, and locomotion level  PARTICIPATION LIMITATIONS: yard work  [place] and Past/current experiences are also affecting patient's functional outcome.   REHAB POTENTIAL: Good  CLINICAL DECISION MAKING: Stable/uncomplicated  EVALUATION  COMPLEXITY: Low   GOALS: Goals reviewed with patient? Yes  SHORT TERM GOALS: Target date: 04/07/2022  I with initial  hEP Baseline: Goal status: met 03/17/22  LONG TERM GOALS: Target date: 05/05/22  I with final HEP Baseline:  Goal status: evolving  2.  Achieve at least 69 on FOTO Baseline: 60 Goal status: NOT captured at eval to deferred  3.  Patient will report improved tolerance of his yardwork, at least 2 hours with pain/tingling < 3/5 Baseline: Reports pain with yardwork, up to 5/10 Goal status: Progressing 04/18/22  4.  Patient will improve 5 x STS to < 10 sec Baseline: 13 Goal status: met 03/17/22  5.  Patient will report improved functional use of R UE without pain. Baseline: up to 5/10 Goal status: 03/28/22 met     PLAN: PT FREQUENCY: 2x/week  PT DURATION: 10 weeks  PLANNED INTERVENTIONS: Therapeutic exercises, Therapeutic activity, Neuromuscular re-education, Balance training, Gait training, Patient/Family education, Joint mobilization, Dry Needling, Electrical stimulation, Moist heat, Taping, Ionotophoresis 18m/ml Dexamethasone, and Manual therapy.  PLAN FOR NEXT SESSION: progress func strength and stretching   Patient Details  Name: TTRICIA OAXACAMRN: [id number] of Birth: 104/17/1933Referring Provider:  RCharleston Poot MD  Encounter Date: 04/18/2022  SEthel RanaDPT 04/18/22 9:09 AM   AFranki MontePTA  04/18/2022, 7:53 AM  [place] [address]Phone: [phone]  Fax:  3[place] [address]Phone: [phone]  Fax:  [phone]

## 2022-04-20 ENCOUNTER — Ambulatory Visit: Payer: Medicare Other | Admitting: Physical Therapy

## 2022-04-20 DIAGNOSIS — R2689 Other abnormalities of gait and mobility: Secondary | ICD-10-CM

## 2022-04-20 DIAGNOSIS — M5459 Other low back pain: Secondary | ICD-10-CM | POA: Diagnosis not present

## 2022-04-20 NOTE — Therapy (Signed)
OUTPATIENT PHYSICAL THERAPY THORACOLUMBAR   Patient Name: Jon Lam MRN: [id number] DOB:03-04-1932, 88 y.o., male Today's Date: 04/20/2022  Progress Note Reporting Period 02/24/22 to 04/18/22  See note below for Objective Data and Assessment of Progress/Goals.       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 A bit sore after last session   PERTINENT HISTORY:  Patient underwent L3-4, L45 laminectomy on 11/14/21.  PAIN:  Are you having pain? LBP 3/10   PRECAUTIONS: None  WEIGHT BEARING RESTRICTIONS No  FALLS:  Has patient fallen in last 6 months? 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 Generalized tightness noted in lumbar paraspinals and lower trunk/hip musculature.  LUMBAR ROM:   Active  A/PROM  eval  Flexion Mid shin  Extension WNL  Right lateral flexion 3" > knee  Left lateral flexion 2" > knee  Right rotation < 1/4  Left rotation <1/4   (Blank rows = not tested)  LOWER EXTREMITY ROM:   Generalized  tightness throughout hips  LOWER EXTREMITY MMT:  4+/5 throughout  FUNCTIONAL TESTS:  5 times sit to stand: 13.15 Timed up and go (TUG): 12.56  03/17/22 5 x STS: 11.33 TUG: no AD 9.32 sec  TODAY'S TREATMENT  04/20/22  Nustep L 5 20mn Cable pulleys 2 sets 10 shld ext and row 10# Resisted gait 4 ways 4 x 30# Trunk flex/ext black tband 2 sets 10 Lat Pull Down and Seated Row 25# 2 sets 15 Cable Pulley Rotation and AR press 10# 15 x BIL Leg Press 40# 2 sets 15 Wt ball OH press with extension 2 sets 10  PROM and stretching LE and trunk  04/18/22  Nustep L 5 679m Lat Pull and Seated Row 25# 2 sets 15 Black tband trunk ext and flex 2 sets 10 10# cable pulley shld ext and row with cues for core engagement 2 sets 10 each Upright row into extension 2 sets 10 15# STS wt ball 10x with chest press, 10 x with OH press Green tband trunk rotation 15 x each 10# AR press 15 x BIL  PROM and stretching BIl HS, IT, pirformis and trunk  04/14/22  Nustep L 5 69m55mLat Pull and Seated Row 25# 2 sets 10 Black tband trunk ext and flex 2 sets 10 10# cable pulley shld ext and row with cues for core engagement 2 sets 10 each Upright row into extension 2 sets 10 15# STS wt ball 10x with chest press, 10 x with OH press Green tbnad trunk rotation 15 x each PROM and stretching BIl HS, IT, pirformis and trunk    04/06/22  Nustep L 5 69mi55mResisted Gait fwd and back 5 x each and then laterally 3 x each 30# 6 inch step up  fwd with opp leg ext 10 each, UE support 6 inch lateral step with opp leg abd 10 each , UE support STS with wt ball press 10 x then 10 x with OH press Standing OH ext 10 then rotation 10 each way Seated row 25# 2 sets 10 Lat pull down 25# 2 sets 10 Black tband trunk ext and flex 2 sets 10 PROM LE and trunk    03/31/22 NuStep L5 x 69min28mhen 2 x 30 sec at L6 for strength. Knees hurt so stopped. Lat Pull 2 x 10 _0 , then 1 x 10 @ 25# Seated Rows 25# 2 x 10 Trunk ext with Black Tband resistance 2 x 10 Supine Passive stretch to HS, ITB, piriformis, KTC Standing hip abd against red Tband resistance at knees 2 x 10 each STS 2 x 10, 10 with OHP and 10 with chest press, 4# ball Lateral step ups 2 x 10 each way, 4" step, BUE support     03/10/22 Hawkins test (+)-R STM to R pects with stretch, scapular mobs R Supine LE stretch-HS and SKTC-Patient reported increased neck strain, so deferred further supine stretching Supine bridge with G theraband abd resistance, SLR, 10 reps each Standing sh ext, rows, ER with G tband 10 each   02/24/22 Education, ROM   PATIENT EDUCATION:  Education details: None Person educated: Patient Education method: Explanation Education comprehension: verbalized understanding   HOME EXERCISE PROGRAM:  Encouraged doing daily ASSESSMENT:  CLINICAL IMPRESSION: progressed strengthening ex, added various intervention and increased wt. Postural cuing needed  OBJECTIVE IMPAIRMENTS decreased coordination, decreased endurance, decreased ROM, decreased strength, increased muscle spasms, and pain.   ACTIVITY LIMITATIONS carrying, lifting, bending, reach over head, and locomotion level  PARTICIPATION  LIMITATIONS: yard work  PERSONAL FACTORS Age and Past/current experiences are also affecting patient's functional outcome.   REHAB POTENTIAL: Good  CLINICAL DECISION MAKING: Stable/uncomplicated  EVALUATION COMPLEXITY: Low   GOALS: Goals reviewed with  patient? Yes  SHORT TERM GOALS: Target date: 04/07/2022  I with initial hEP Baseline: Goal status: met 03/17/22  LONG TERM GOALS: Target date: 05/05/22  I with final HEP Baseline:  Goal status: evolving  2.  Achieve at least 69 on FOTO Baseline: 60 Goal status: NOT captured at eval to deferred  3.  Patient will report improved tolerance of his yardwork, at least 2 hours with pain/tingling < 3/5 Baseline: Reports pain with yardwork, up to 5/10 Goal status: Progressing 04/18/22  4.  Patient will improve 5 x STS to < 10 sec Baseline: 13 Goal status: met 03/17/22  5.  Patient will report improved functional use of R UE without pain. Baseline: up to 5/10 Goal status: 03/28/22 met     PLAN: PT FREQUENCY: 2x/week  PT DURATION: 10 weeks  PLANNED INTERVENTIONS: Therapeutic exercises, Therapeutic activity, Neuromuscular re-education, Balance training, Gait training, Patient/Family education, Joint mobilization, Dry Needling, Electrical stimulation, Moist heat, Taping, Ionotophoresis 32m/ml Dexamethasone, and Manual therapy.  PLAN FOR NEXT SESSION: progress func strength and stretching   Patient Details  Name: Jon DIEMERMRN: [id number] of Birth: 107-Jul-1933Referring Provider:  RCharleston Poot MD  Encounter Date: 04/20/2022  SEthel RanaDPT 04/20/22 7:54 AM   AFranki MontePTA  04/20/2022, 7:54 AM  [place] [address]Phone: [phone]  Fax:  [address] [place] [address]Phone: [phone]  Fax:  [address] [address]Phone: [phone]  Fax:  [phone] Patient Details  Name: Jon RICKEMRN: [id number] of Birth: 1November 23, 1933Referring Provider:  RCharleston Poot  MD  Encounter Date: 04/20/2022   PLaqueta Carina PTA 04/20/2022, 7:54 AM  [place] [address]Phone: [phone]  Fax:  [phone]

## 2022-04-28 ENCOUNTER — Ambulatory Visit: Payer: Medicare Other | Attending: Physician Assistant | Admitting: Physical Therapy

## 2022-04-28 DIAGNOSIS — M5459 Other low back pain: Secondary | ICD-10-CM | POA: Insufficient documentation

## 2022-04-28 NOTE — Therapy (Signed)
OUTPATIENT PHYSICAL THERAPY THORACOLUMBAR   Patient Name: Jon Lam MRN: [id number] DOB:[birth year], 88 y.o., male Today's Date: 04/28/2022  Progress Note Reporting Period 02/24/22 to 04/18/22  See note below for Objective Data and Assessment of Progress/Goals.       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 More good days than day. I think just one bad day since I was here last. Vascular study, some blockage RT side but not worth risk per MD- could be causing some of pain?    PERTINENT HISTORY:  Patient underwent L3-4, L45 laminectomy on 11/14/21.  PAIN:  Are you having pain? LBP 3/10   PRECAUTIONS: None  WEIGHT BEARING RESTRICTIONS No  FALLS:  Has patient fallen in last 6 months? No  LIVING ENVIRONMENT: Lives with: lives alone Lives in:  House/apartment   OCCUPATION: retired-does yard work  PLOF: Independent  PATIENT GOALS Decreased pain   OBJECTIVE:    PATIENT SURVEYS:  FOTO 60.8  MUSCLE LENGTH: Patient with tightness in B HS and hip flexors  POSTURE: forward head  PALPATION: Patient reports no TTP. Generalized tightness noted in lumbar paraspinals and lower trunk/hip musculature.  LUMBAR ROM:   Active  A/PROM  eval 04/28/22  Flexion Mid shin Top of shoes  Extension WNL WNL  Right lateral flexion 3" >  knee WFL  Left lateral flexion 2" > knee WFL  Right rotation < 1/4 WFL  Left rotation <1/4 WFL   (Blank rows = not tested)  LOWER EXTREMITY ROM:   Generalized tightness throughout hips  LOWER EXTREMITY MMT:  4+/5 throughout  FUNCTIONAL TESTS:  5 times sit to stand: 13.15 Timed up and go (TUG): 12.56  03/17/22 5 x STS: 11.33 TUG: no AD 9.32 sec  TODAY'S TREATMENT  04/28/22  Nustep L 5 523mn Black tband trunk flex an ext 20 x Lat Pull and Seated Row 25# 2 sets 12 Wt ball back ext 12 x and trunk rotation 12 each Green tband hip ext and abd 12 x BIL Feet on ball bridge, KTC and obl 15 x Iso abs with ball PROM LE and trunk       04/20/22  Nustep L 5 667m Cable pulleys 2 sets 10 shld ext and row 10# Resisted gait 4 ways 4 x 30# Trunk flex/ext black tband 2 sets 10 Lat Pull Down and Seated Row 25# 2 sets 15 Cable Pulley Rotation and AR press 10# 15 x BIL Leg Press 40# 2 sets 15 Wt ball OH press with extension 2 sets 10  PROM and stretching LE and trunk  04/18/22  Nustep L 5 23m123mLat Pull and Seated Row 25# 2 sets 15 Black tband trunk ext and flex 2 sets 10 10# cable pulley shld ext and row with cues for core engagement 2 sets 10 each Upright row into extension 2 sets 10 15# STS wt ball 10x with chest press, 10 x with OH press Green tband trunk rotation 15 x each 10# AR press 15 x BIL  PROM and stretching BIl HS, IT, pirformis and trunk  04/14/22  Nustep L 5 23mi64mat Pull  and Seated Row 25# 2 sets 10 Black tband trunk ext and flex 2 sets 10 10# cable pulley shld ext and row with cues for core engagement 2 sets 10 each Upright row into extension 2 sets 10 15# STS wt ball 10x with chest press, 10 x with OH press Green tbnad trunk rotation 15 x each PROM and stretching BIl HS, IT, pirformis and trunk    04/06/22  Nustep L 5 23min90mesisted Gait fwd and back 5 x each and then laterally 3 x each 30# 6 inch step up fwd with opp leg ext 10 each, UE support 6 inch lateral step with opp leg abd 10 each , UE support STS with wt ball press 10 x then 10 x with OH press Standing OH ext 10 then rotation 10 each way Seated row 25# 2 sets 10 Lat pull down 25# 2 sets 10 Black tband trunk ext and flex 2 sets 10 PROM LE and trunk    03/31/22 NuStep L5 x 23min,38men 2 x 30 sec at L6 for strength. Knees hurt so stopped. Lat Pull 2 x 10 @15 , then 1 x 10 @ 25# Seated Rows 25# 2 x 10 Trunk ext with Black Tband resistance 2 x 10 Supine Passive stretch to HS, ITB, piriformis, KTC Standing hip abd against red Tband resistance at knees 2 x 10 each STS 2 x 10, 10 with OHP and 10 with chest press, 4# ball Lateral step ups 2 x 10 each way, 4" step, BUE support     03/10/22 Hawkins test (+)-R STM to R pects with stretch, scapular mobs R Supine LE stretch-HS and SKTC-Patient reported increased neck strain, so deferred further supine  stretching Supine bridge with G theraband abd resistance, SLR, 10 reps each Standing sh ext, rows, ER with G tband 10 each   02/24/22 Education, ROM   PATIENT EDUCATION:  Education details: None Person educated: Patient Education method: Explanation Education comprehension: verbalized understanding   HOME EXERCISE PROGRAM:  Encouraged doing daily ASSESSMENT:  CLINICAL IMPRESSION: progressed strengthening ex adding in hips and rotation. Lumb ROM improving. Progressing with goals  OBJECTIVE IMPAIRMENTS decreased coordination,  decreased endurance, decreased ROM, decreased strength, increased muscle spasms, and pain.   ACTIVITY LIMITATIONS carrying, lifting, bending, reach over head, and locomotion level  PARTICIPATION LIMITATIONS: yard work  [place] Age and Past/current experiences are also affecting patient's functional outcome.   REHAB POTENTIAL: Good  CLINICAL DECISION MAKING: Stable/uncomplicated  EVALUATION COMPLEXITY: Low   GOALS: Goals reviewed with patient? Yes  SHORT TERM GOALS: Target date: 04/07/2022  I with initial hEP Baseline: Goal status: met 03/17/22  LONG TERM GOALS: Target date: 05/05/22  I with final HEP Baseline:  Goal status: evolving  2.  Achieve at least 69 on FOTO Baseline: 60 Goal status: NOT captured at eval to deferred  3.  Patient will report improved tolerance of his yardwork, at least 2 hours with pain/tingling < 3/5 Baseline: Reports pain with yardwork, up to 5/10 Goal status: Progressing 04/18/22. Progressing 04/28/22  4.  Patient will improve 5 x STS to < 10 sec Baseline: 13 Goal status: met 03/17/22  5.  Patient will report improved functional use of R UE without pain. Baseline: up to 5/10 Goal status: 03/28/22 met     PLAN: PT FREQUENCY: 2x/week  PT DURATION: 10 weeks  PLANNED INTERVENTIONS: Therapeutic exercises, Therapeutic activity, Neuromuscular re-education, Balance training, Gait training, Patient/Family education, Joint mobilization, Dry Needling, Electrical stimulation, Moist heat, Taping, Ionotophoresis 18m/ml Dexamethasone, and Manual therapy.  PLAN FOR NEXT SESSION: next week renew vs D/C   Patient Details  Name: TSTOKES RATTIGANMRN: [id number] of Birth: 1Jan 31, 1933Referring Provider:  RCharleston Poot MD  Encounter Date: 04/28/2022     AFranki MontePTA  04/28/2022, 8:38 AM  [place] [address]Phone: [phone]  Fax:   [address] [address]Phone: [phone]  Fax:  [address] [address]Phone: [phone]  Fax:  [phone] Patient Details  Name: TADMIR CANDELASMRN: [id number] of Birth: [birth year]Referring Provider:  RCharleston Poot MD  Encounter Date: 04/28/2022   PLaqueta Carina PTA 04/28/2022, 8:38 AM  [place] [address]Phone: [phone]  Fax:  3[place] [address]Phone: [phone]  Fax:  [phone]

## 2022-05-09 ENCOUNTER — Encounter: Payer: Self-pay | Admitting: Physical Therapy

## 2022-05-09 ENCOUNTER — Ambulatory Visit: Payer: Medicare Other | Admitting: Physical Therapy

## 2022-05-09 DIAGNOSIS — M5459 Other low back pain: Secondary | ICD-10-CM

## 2022-05-09 NOTE — Therapy (Signed)
OUTPATIENT PHYSICAL THERAPY THORACOLUMBAR   Patient Name: Jon Lam MRN: [id number] DOB:1932/06/21, 88 y.o., male Today's Date: 05/09/2022  Progress Note Reporting Period 02/24/22 to 04/18/22  See note below for Objective Data and Assessment of Progress/Goals.       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 07/10/2012    PCP: Charleston Poot  REFERRING PROVIDER: Ignacia Bayley, PA-C   REFERRING DIAG: Diagnosis [phone] (ICD-10-CM) - Spinal stenosis, lumbar region with neurogenic claudication   Rationale for Evaluation and Treatment Rehabilitation  THERAPY DIAG:  Other low back pain  ONSET DATE: 11/14/21  SUBJECTIVE:                                                                                                                                                                                           SUBJECTIVE STATEMENT:  sore and stiff, busy with projects  PERTINENT HISTORY:  Patient underwent L3-4, L45 laminectomy on 11/14/21.  PAIN:  Are you having pain? LBP 2/10   PRECAUTIONS: None  WEIGHT BEARING RESTRICTIONS No  FALLS:  Has patient fallen in last 6 months? No  LIVING ENVIRONMENT: Lives with: lives alone Lives in: House/apartment   OCCUPATION: retired-does yard work  PLOF: Independent  PATIENT GOALS Decreased pain   OBJECTIVE:    PATIENT SURVEYS:  FOTO 60.8  MUSCLE LENGTH: Patient with  tightness in B HS and hip flexors  POSTURE: forward head  PALPATION: Patient reports no TTP. Generalized tightness noted in lumbar paraspinals and lower trunk/hip musculature.  LUMBAR ROM:   Active  A/PROM  eval 04/28/22  Flexion Mid shin Top of shoes  Extension WNL WNL  Right lateral flexion 3" > knee WFL  Left lateral flexion 2" > knee WFL  Right rotation < 1/4 WFL  Left rotation <1/4 WFL   (Blank rows = not tested)  LOWER EXTREMITY ROM:   Generalized  tightness throughout hips  LOWER EXTREMITY MMT:  4+/5 throughout  FUNCTIONAL TESTS:  5 times sit to stand: 13.15 Timed up and go (TUG): 12.56  03/17/22 5 x STS: 11.33 TUG: no AD 9.32 sec  TODAY'S TREATMENT  05/05/22 PROM LE and trunk - LLE noteably tighter than RT  Feet on ball- bridge, KTC , obl and isometric abdominals 15 x each SLR red tband 10 x, SLR with abd red tband 10x each Nustep L 5 6 min Resisted gait 4 way 5 x each  AR press and trunk rotation 15 each Black tband trunk flex/ext 20 x Lat pull and seated row 25# 2 sets 12  04/28/22  Nustep L 5 48mn Black tband trunk flex an ext 20 x Lat Pull and Seated Row 25# 2 sets 12 Wt ball back ext 12 x and trunk rotation 12 each Green tband hip ext and abd 12 x BIL Feet on ball bridge, KTC and obl 15 x Iso abs with ball PROM LE and trunk       04/20/22  Nustep L 5 61m Cable pulleys 2 sets 10 shld ext and row 10# Resisted gait 4 ways 4 x 30# Trunk flex/ext black tband 2 sets 10 Lat Pull Down and Seated Row 25# 2 sets 15 Cable Pulley Rotation and AR press 10# 15 x BIL Leg Press 40# 2 sets 15 Wt ball OH press with extension 2 sets 10  PROM and stretching LE and trunk  04/18/22  Nustep L 5 33m8mLat Pull and Seated Row 25# 2 sets 15 Black tband trunk ext and flex 2 sets 10 10# cable pulley shld ext and row with cues for core engagement 2 sets 10 each Upright row into extension 2 sets 10 15# STS wt ball 10x with chest press, 10 x with OH press Green  tband trunk rotation 15 x each 10# AR press 15 x BIL  PROM and stretching BIl HS, IT, pirformis and trunk  04/14/22  Nustep L 5 33mi85mat Pull and Seated Row 25# 2 sets 10 Black tband trunk ext and flex 2 sets 10 10# cable pulley shld ext and row with cues for core engagement 2 sets 10 each Upright row into extension 2 sets 10 15# STS wt ball 10x with chest press, 10 x with OH press Green tbnad trunk rotation 15 x each PROM and stretching BIl HS, IT, pirformis and trunk    04/06/22  Nustep L 5 33min37mesisted Gait fwd and back 5 x each and then laterally 3 x each 30# 6 inch step up fwd with opp leg ext 10 each, UE support 6 inch lateral step with opp leg abd 10 each , UE support STS with wt ball press 10 x then 10 x with OH press Standing OH ext 10 then rotation 10 each way Seated row 25# 2 sets 10 Lat pull down 25# 2 sets 10 Black tband trunk ext and flex 2 sets 10 PROM LE and trunk    03/31/22 NuStep L5 x 33min,18men 2 x 30 sec at L6 for strength. Knees hurt so stopped. Lat Pull 2 x 10 _0 , then 1 x 10 @ 25# Seated Rows 25# 2 x 10 Trunk ext with Black Tband resistance 2 x 10 Supine Passive stretch to HS, ITB, piriformis, KTC Standing hip abd against red Tband resistance at knees 2 x 10 each STS 2 x 10, 10 with OHP and 10 with chest press, 4# ball Lateral step ups 2  x 10 each way, 4" step, BUE support     03/10/22 Hawkins test (+)-R STM to R pects with stretch, scapular mobs R Supine LE stretch-HS and SKTC-Patient reported increased neck strain, so deferred further supine stretching Supine bridge with G theraband abd resistance, SLR, 10 reps each Standing sh ext, rows, ER with G tband 10 each   02/24/22 Education, ROM   PATIENT EDUCATION:  Education details: None Person educated: Patient Education method: Explanation Education comprehension: verbalized understanding   HOME EXERCISE PROGRAM:  Encouraged doing daily ASSESSMENT:  CLINICAL IMPRESSION: pt  arrived with c/o sore and stiff, LLE much tighter than RT . Pt is a very acvtive person and educ on need to stretch daily. Progressed strengtehing with cuing OBJECTIVE IMPAIRMENTS decreased coordination, decreased endurance, decreased ROM, decreased strength, increased muscle spasms, and pain.   ACTIVITY LIMITATIONS carrying, lifting, bending, reach over head, and locomotion level  PARTICIPATION LIMITATIONS: yard work  [place] Age and Past/current experiences are also affecting patient's functional outcome.   REHAB POTENTIAL: Good  CLINICAL DECISION MAKING: Stable/uncomplicated  EVALUATION COMPLEXITY: Low   GOALS: Goals reviewed with patient? Yes  SHORT TERM GOALS: Target date: 04/07/2022  I with initial hEP Baseline: Goal status: met 03/17/22  LONG TERM GOALS: Target date: 05/05/22  I with final HEP Baseline:  Goal status: evolving  2.  Achieve at least 69 on FOTO Baseline: 60 Goal status: NOT captured at eval to deferred  3.  Patient will report improved tolerance of his yardwork, at least 2 hours with pain/tingling < 3/5 Baseline: Reports pain with yardwork, up to 5/10 Goal status: Progressing 04/18/22. Progressing 04/28/22 Progressing and varies with activity 05/05/22  4.  Patient will improve 5 x STS to < 10 sec Baseline: 13 Goal status: met 03/17/22  5.  Patient will report improved functional use of R UE without pain. Baseline: up to 5/10 Goal status: 03/28/22 met     PLAN: PT FREQUENCY: 1-2x/week  PT DURATION: 10 weeks  PLANNED INTERVENTIONS: Therapeutic exercises, Therapeutic activity, Neuromuscular re-education, Balance training, Gait training, Patient/Family education, Joint mobilization, Dry Needling, Electrical stimulation, Moist heat, Taping, Ionotophoresis 41m/ml Dexamethasone, and Manual therapy.  PLAN FOR NEXT SESSION: renewal for a couple add'l visits   Patient Details  Name: TKHALED HERDAMRN: [id number] of Birth:  1Dec 21, 1933Referring Provider:  RCharleston Poot MD  Encounter Date: 05/09/2022     AFranki MontePTA  05/09/2022, 9:44 AM  [place] [address]Phone: [phone]  Fax:  [address] [address]Phone: [phone]  Fax:  3[place] [address]Phone: [phone]  Fax:  [phone] Patient Details  Name: TARAFAT COCUZZAMRN: [id number] of Birth: [birth year]Referring Provider:  RCharleston Poot MD  Encounter Date: 05/09/2022   SMarcelina Morel PT 05/09/2022, 9:44 AM  [place] [address]Phone: [phone]  Fax:  3[place] [address]Phone: [phone]  Fax:  3[place] [address]Phone: [phone]  Fax:  [phone] Patient Details  Name: TBRETT DARKOMRN: [id number] of Birth: [birth year]Referring Provider:  Charleston Poot, MD  Encounter Date: 05/09/2022  Ethel Rana DPT 05/09/22 9:44 AM   Marcelina Morel, PT 05/09/2022, 9:44 AM  [place]. [place], [place], [address] Phone: [phone]   Fax:  [phone]

## 2022-05-16 ENCOUNTER — Ambulatory Visit: Payer: Medicare Other | Admitting: Physical Therapy

## 2022-05-16 DIAGNOSIS — M5459 Other low back pain: Secondary | ICD-10-CM

## 2022-05-16 NOTE — Therapy (Signed)
OUTPATIENT PHYSICAL THERAPY THORACOLUMBAR   Patient Name: Jon Lam MRN: [id number] DOB:07/16/1932, 88 y.o., male Today's Date: 05/16/2022  Progress Note Reporting Period 02/24/22 to 04/18/22  See note below for Objective Data and Assessment of Progress/Goals.       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 07/10/2012    PCP: Charleston Poot  REFERRING PROVIDER: Ignacia Bayley, PA-C   REFERRING DIAG: Diagnosis [phone] (ICD-10-CM) - Spinal stenosis, lumbar region with neurogenic claudication   Rationale for Evaluation and Treatment Rehabilitation  THERAPY DIAG:  Other low back pain  ONSET DATE: 11/14/21  SUBJECTIVE:                                                                                                                                                                                           SUBJECTIVE STATEMENT:  back doing pretty well, struggling with some leg cramps  PERTINENT HISTORY:  Patient underwent L3-4, L45 laminectomy on 11/14/21.  PAIN:  Are you having pain? LBP 0/10   PRECAUTIONS: None  WEIGHT BEARING RESTRICTIONS No  FALLS:  Has patient fallen in last 6 months? 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 Generalized tightness noted in lumbar paraspinals and lower trunk/hip musculature.  LUMBAR ROM:   Active  A/PROM  eval 04/28/22  Flexion Mid shin Top of shoes  Extension WNL WNL  Right lateral flexion 3" > knee WFL  Left lateral flexion 2" > knee WFL  Right rotation < 1/4 WFL  Left rotation <1/4 WFL   (Blank rows = not tested)  LOWER EXTREMITY ROM:  Generalized tightness throughout hips  LOWER EXTREMITY MMT:  4+/5 throughout  FUNCTIONAL TESTS:  5 times sit to stand: 13.15 Timed up and go (TUG): 12.56  03/17/22 5 x STS: 11.33 TUG: no AD 9.32 sec  TODAY'S TREATMENT  05/16/22 Nustep L 6 64mn Black bar calf raise and toe raise 20 each Leg Press 3 sets 10 feet 3 ways then calf raises 2 sets 10 40# STS with wt ball 2 sets 10 Cable pulley trunk rotation and AR press 10# Black tband trunk flex and ext 20 x each Lat pull 2 set s10  Calf stretches on step LE and trunk stretching  05/05/22 PROM LE and trunk - LLE noteably tighter than RT  Feet on ball- bridge, KTC , obl and isometric abdominals 15 x each SLR red tband 10 x, SLR with abd red tband 10x each Nustep L 5 6 min Resisted gait 4 way 5 x each  AR press and trunk rotation 15 each Black tband trunk flex/ext 20 x Lat pull and seated row 25# 2 sets 12  04/28/22  Nustep L 5 625m Black tband trunk flex an ext 20 x Lat Pull and Seated Row 25# 2 sets 12 Wt ball back ext 12 x and trunk rotation 12 each Green tband hip ext and abd 12 x BIL Feet on ball bridge, KTC and obl 15 x Iso abs with ball PROM LE and trunk       04/20/22  Nustep L 5 29m329mCable pulleys 2 sets 10 shld ext and row 10# Resisted gait 4 ways 4 x 30# Trunk flex/ext black tband 2 sets 10 Lat Pull Down and Seated Row 25# 2 sets 15 Cable Pulley Rotation and AR press 10# 15 x BIL Leg Press 40# 2 sets 15 Wt ball OH press with extension  2 sets 10  PROM and stretching LE and trunk  04/18/22  Nustep L 5 29mi51mat Pull and Seated Row 25# 2 sets 15 Black tband trunk ext and flex 2 sets 10 10# cable pulley shld ext and row with cues for core engagement 2 sets 10 each Upright row into extension 2 sets 10 15# STS wt ball 10x with chest press, 10 x with OH press Green tband trunk rotation 15 x each 10# AR press 15 x BIL  PROM and stretching BIl HS, IT, pirformis and trunk  04/14/22  Nustep L 5 29min77mt Pull and Seated Row 25# 2 sets 10 Black tband trunk ext and flex 2 sets 10 10# cable pulley shld ext and row with cues for core engagement 2 sets 10 each Upright row into extension 2 sets 10 15# STS wt ball 10x with chest press, 10 x with OH press Green tbnad trunk rotation 15 x each PROM and stretching BIl HS, IT, pirformis and trunk    04/06/22  Nustep L 5 29min 17msisted Gait fwd and back 5 x each and then laterally 3 x each 30# 6 inch step up fwd with opp leg ext 10 each, UE support 6 inch lateral step with opp leg abd 10 each , UE support STS with wt ball press 10 x then 10 x with OH press Standing OH ext 10 then rotation 10 each way Seated row 25# 2 sets 10 Lat pull down 25# 2 sets 10 Black tband trunk ext and flex 2 sets 10 PROM LE and trunk    03/31/22 NuStep L5 x 29min, 75mn 2 x 30 sec at L6 for strength. Knees  hurt so stopped. Lat Pull 2 x 10 _0 , then 1 x 10 @ 25# Seated Rows 25# 2 x 10 Trunk ext with Black Tband resistance 2 x 10 Supine Passive stretch to HS, ITB, piriformis, KTC Standing hip abd against red Tband resistance at knees 2 x 10 each STS 2 x 10, 10 with OHP and 10 with chest press, 4# ball Lateral step ups 2 x 10 each way, 4" step, BUE support     03/10/22 Hawkins test (+)-R STM to R pects with stretch, scapular mobs R Supine LE stretch-HS and SKTC-Patient reported increased neck strain, so deferred further supine stretching Supine bridge with G theraband abd resistance, SLR, 10  reps each Standing sh ext, rows, ER with G tband 10 each   02/24/22 Education, ROM   PATIENT EDUCATION:  Education details: None Person educated: Patient Education method: Explanation Education comprehension: verbalized understanding   HOME EXERCISE PROGRAM:  Encouraged doing daily ASSESSMENT:  CLINICAL IMPRESSION:  added in more calf ex and stretching and educ on stretching more at home and drinking more water. Overall back is doing well- seeing surgeon tomorrow  OBJECTIVE IMPAIRMENTS decreased coordination, decreased endurance, decreased ROM, decreased strength, increased muscle spasms, and pain.   ACTIVITY LIMITATIONS carrying, lifting, bending, reach over head, and locomotion level  PARTICIPATION LIMITATIONS: yard work  [place] Age and Past/current experiences are also affecting patient's functional outcome.   REHAB POTENTIAL: Good  CLINICAL DECISION MAKING: Stable/uncomplicated  EVALUATION COMPLEXITY: Low   GOALS: Goals reviewed with patient? Yes  SHORT TERM GOALS: Target date: 04/07/2022  I with initial hEP Baseline: Goal status: met 03/17/22  LONG TERM GOALS: Target date: 05/05/22  I with final HEP Baseline:  Goal status: evolving  2.  Achieve at least 69 on FOTO Baseline: 60 Goal status: NOT captured at eval to deferred  3.  Patient will report improved tolerance of his yardwork, at least 2 hours with pain/tingling < 3/5 Baseline: Reports pain with yardwork, up to 5/10 Goal status: Progressing 04/18/22. Progressing 04/28/22 Progressing and varies with activity 05/05/22  4.  Patient will improve 5 x STS to < 10 sec Baseline: 13 Goal status: met 03/17/22  5.  Patient will report improved functional use of R UE without pain. Baseline: up to 5/10 Goal status: 03/28/22 met     PLAN: PT FREQUENCY: 1-2x/week  PT DURATION: 10 weeks  PLANNED INTERVENTIONS: Therapeutic exercises, Therapeutic activity, Neuromuscular re-education, Balance training,  Gait training, Patient/Family education, Joint mobilization, Dry Needling, Electrical stimulation, Moist heat, Taping, Ionotophoresis 41m/ml Dexamethasone, and Manual therapy.  PLAN FOR NEXT SESSION: D/C   Patient Details  Name: TREACE BRESHEARSMRN: [id number] of Birth: [birth year]Referring Provider:  RCharleston Poot MD  Encounter Date: 05/16/2022     AFranki MontePTA  05/16/2022, 8:08 AM  [place] [address]Phone: [phone]  Fax:  [address] [address]Phone: [phone]  Fax:  [address] [address]Phone: [phone]  Fax:  [phone] Patient Details  Name: TLAMARCUS SPIRAMRN: [id number] of Birth: [birth year]Referring Provider:  RCharleston Poot MD  Encounter Date: 05/16/2022   PLaqueta Carina PTA 05/16/2022, 8:08 AM  [place] [address]Phone: [phone]  Fax:  [place]. [place], [place], [address] Phone: [phone]   Fax:  [place]. [place], [place], [address] Phone: [phone]   Fax:  [phone]  Patient Details  Name: BEREN YNIGUEZ MRN: [id number] Date of Birth: [birth year] Referring Provider:  Charleston Poot, MD  Encounter Date: 05/16/2022  Ethel Rana DPT 05/16/22 8:08 AM   Laqueta Carina, PTA 05/16/2022, 8:08 AM  [place]. [place], [place], [address] Phone: [phone]   Fax:  [place]. [place], [place], [address] Phone: [phone]   Fax:  [phone]  Patient Details  Name: ADE STMARIE MRN: [id number] Date of Birth: 1932/06/25 Referring Provider:  Charleston Poot, MD  Encounter Date: 05/16/2022   Laqueta Carina, PTA 05/16/2022, 8:08 AM  [place]. [place], [place], [address] Phone: [phone]   Fax:  [phone]

## 2022-05-18 ENCOUNTER — Ambulatory Visit: Payer: Medicare Other | Admitting: Physical Therapy

## 2022-05-19 ENCOUNTER — Ambulatory Visit: Payer: Medicare Other | Admitting: Physical Therapy

## 2022-05-19 DIAGNOSIS — M5459 Other low back pain: Secondary | ICD-10-CM

## 2022-05-19 NOTE — Therapy (Signed)
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   Past Surgical History:  Procedure Laterality Date   Biopsy of Finger  05/20/12   Left 4th finger   Cardia Stents  2003 0r 2004   Patient Active Problem List   Diagnosis Date Noted   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 07/10/2012    PCP: Charleston Poot  REFERRING PROVIDER: Ignacia Bayley, PA-C   REFERRING DIAG: Diagnosis [phone] (ICD-10-CM) - Spinal stenosis, lumbar region with neurogenic claudication   Rationale for Evaluation and Treatment Rehabilitation  THERAPY DIAG:  Other low back pain  ONSET DATE: 11/14/21  SUBJECTIVE:                                                                                                                                                                                           SUBJECTIVE STATEMENT: stretching more and doing HEP  PERTINENT HISTORY:  Patient underwent L3-4, L45 laminectomy on 11/14/21.  PAIN:  Are you having pain? LBP 0/10   PRECAUTIONS: None  WEIGHT BEARING RESTRICTIONS No  FALLS:  Has patient fallen in last 6 months? No  LIVING ENVIRONMENT: Lives with: lives alone Lives in: House/apartment   OCCUPATION: retired-does yard work  PLOF: Independent  PATIENT GOALS Decreased pain   OBJECTIVE:    PATIENT SURVEYS:  FOTO 60.8  MUSCLE LENGTH: Patient with tightness in B HS and hip flexors  POSTURE: forward head  PALPATION: Patient reports no TTP. Generalized tightness noted in lumbar  paraspinals and lower trunk/hip musculature.  LUMBAR ROM:   Active  A/PROM  eval 04/28/22  Flexion Mid shin Top of shoes  Extension WNL WNL  Right lateral flexion 3" > knee WFL  Left lateral flexion 2" > knee WFL  Right rotation < 1/4 WFL  Left rotation <1/4 WFL   (Blank rows = not tested)  LOWER EXTREMITY ROM:   Generalized tightness throughout hips  LOWER EXTREMITY MMT:  4+/5 throughout  FUNCTIONAL TESTS:  5 times sit to stand: 13.15 Timed up  and go (TUG): 12.56  03/17/22 5 x STS: 11.33 TUG: no AD 9.32 sec  TODAY'S TREATMENT  05/19/22 Nustep L 6 60mn Black tband trunk flex and ext Seated row and lat 25# 2 sets 15 Leg Press 40# 2 sets15 Calf raises 40# 2 sets 15 Cable pulley trunk rotation and AR press 10# STS with wt ball Core stab ex supine PROM LE and trunk  05/16/22 Nustep L 6 694m Black bar calf raise and toe raise 20 each Leg Press 3 sets 10 feet 3 ways then calf raises 2 sets 10 40# STS with wt ball 2 sets 10 Cable pulley trunk rotation and AR press 10# Black tband trunk flex and ext 20 x each Lat pull 2 set s10  Calf stretches on step LE and trunk stretching  05/05/22 PROM LE and trunk - LLE noteably tighter than RT  Feet on ball- bridge, KTC , obl and isometric abdominals 15 x each SLR red tband 10 x, SLR with abd red tband 10x each Nustep L 5 6 min Resisted gait 4 way 5 x each  AR press and trunk rotation 15 each Black tband trunk flex/ext 20 x Lat pull and seated row 25# 2 sets 12  04/28/22  Nustep L 5 8m73mBlack tband trunk flex an ext 20 x Lat Pull and Seated Row 25# 2 sets 12 Wt ball back ext 12 x and trunk rotation 12 each Green tband hip ext and abd 12 x BIL Feet on ball bridge, KTC and obl 15 x Iso abs with ball PROM LE and trunk       04/20/22  Nustep L 5 8mi38mable pulleys 2 sets 10 shld ext and row 10# Resisted gait 4 ways 4 x 30# Trunk flex/ext black tband 2 sets 10 Lat Pull Down and Seated Row 25# 2 sets 15 Cable Pulley  Rotation and AR press 10# 15 x BIL Leg Press 40# 2 sets 15 Wt ball OH press with extension 2 sets 10  PROM and stretching LE and trunk  04/18/22  Nustep L 5 8min38mt Pull and Seated Row 25# 2 sets 15 Black tband trunk ext and flex 2 sets 10 10# cable pulley shld ext and row with cues for core engagement 2 sets 10 each Upright row into extension 2 sets 10 15# STS wt ball 10x with chest press, 10 x with OH press Green tband trunk rotation 15 x each 10# AR press 15 x BIL  PROM and stretching BIl HS, IT, pirformis and trunk  04/14/22  Nustep L 5 8min 8m Pull and Seated Row 25# 2 sets 10 Black tband trunk ext and flex 2 sets 10 10# cable pulley shld ext and row with cues for core engagement 2 sets 10 each Upright row into extension 2 sets 10 15# STS wt ball 10x with chest press, 10 x with OH press Green tbnad trunk rotation 15 x each PROM and stretching BIl HS, IT, pirformis and trunk    04/06/22  Nustep L 5 8min  68misted Gait fwd and back 5 x each and then laterally 3 x each 30# 6 inch step up fwd with opp leg ext 10 each, UE support 6 inch lateral step with opp leg abd 10 each , UE support STS with wt ball press 10 x then 10 x with OH press Standing OH ext 10 then rotation 10 each way Seated row 25# 2 sets 10 Lat pull down 25# 2 sets 10 Black tband trunk  ext and flex 2 sets 10 PROM LE and trunk    03/31/22 NuStep L5 x 81mn, then 2 x 30 sec at L6 for strength. Knees hurt so stopped. Lat Pull 2 x 10 @15 , then 1 x 10 @ 25# Seated Rows 25# 2 x 10 Trunk ext with Black Tband resistance 2 x 10 Supine Passive stretch to HS, ITB, piriformis, KTC Standing hip abd against red Tband resistance at knees 2 x 10 each STS 2 x 10, 10 with OHP and 10 with chest press, 4# ball Lateral step ups 2 x 10 each way, 4" step, BUE support     03/10/22 Hawkins test (+)-R STM to R pects with stretch, scapular mobs R Supine LE stretch-HS and SKTC-Patient reported increased neck strain, so  deferred further supine stretching Supine bridge with G theraband abd resistance, SLR, 10 reps each Standing sh ext, rows, ER with G tband 10 each   02/24/22 Education, ROM   PATIENT EDUCATION:  Education details: None Person educated: Patient Education method: Explanation Education comprehension: verbalized understanding   HOME EXERCISE PROGRAM:  Encouraged doing daily ASSESSMENT:  CLINICAL IMPRESSION:  all goals met. Pt is very pleased and doing ex at home. Pt agrees with D/C  OBJECTIVE IMPAIRMENTS decreased coordination, decreased endurance, decreased ROM, decreased strength, increased muscle spasms, and pain.   ACTIVITY LIMITATIONS carrying, lifting, bending, reach over head, and locomotion level  PARTICIPATION LIMITATIONS: yard work  PSt. LawrenceAge and Past/current experiences are also affecting patient's functional outcome.   REHAB POTENTIAL: Good  CLINICAL DECISION MAKING: Stable/uncomplicated  EVALUATION COMPLEXITY: Low   GOALS: Goals reviewed with patient? Yes  SHORT TERM GOALS: Target date: 04/07/2022  I with initial hEP Baseline: Goal status: met 03/17/22  LONG TERM GOALS: Target date: 05/05/22  I with final HEP Baseline:  Goal status: met  2.  Achieve at least 69 on FOTO Baseline: 60 Goal status: NOT captured at eval to deferred  3.  Patient will report improved tolerance of his yardwork, at least 2 hours with pain/tingling < 3/5 Baseline: Reports pain with yardwork, up to 5/10 Goal status: Progressing 04/18/22. Progressing 04/28/22 Progressing and varies with activity 05/05/22. Still varies but overall much better and pt is pleased 05/19/22  4.  Patient will improve 5 x STS to < 10 sec Baseline: 13 Goal status: met 03/17/22  5.  Patient will report improved functional use of R UE without pain. Baseline: up to 5/10 Goal status: 03/28/22 met     PLAN: PT FREQUENCY: 1-2x/week  PT DURATION: 10 weeks  PLANNED INTERVENTIONS: Therapeutic  exercises, Therapeutic activity, Neuromuscular re-education, Balance training, Gait training, Patient/Family education, Joint mobilization, Dry Needling, Electrical stimulation, Moist heat, Taping, Ionotophoresis 411mml Dexamethasone, and Manual therapy.  PLAN FOR NEXT SESSION: D/C PHYSICAL THERAPY DISCHARGE SUMMARY  Patient agrees to discharge. Patient goals were partially met. Patient is being discharged due to being pleased with the current functional level.    Patient Details  Name: ThHAREL REPETTORN: [id number] of Birth: [birth year]eferring Provider:  RuCharleston PootMD  Encounter Date: 05/19/2022    MoAndris BaumannDPT  AnFranki MonteTA  05/19/2022, 7:59 AM  [place]: [phone] Fax:  [address]: [phone] Fax:  [address]  Phone: [phone]   Fax:  [phone]  Patient Details  Name: KENON DELASHMIT MRN: [id number] Date of Birth: 04-10-1932 Referring Provider:  Charleston Poot, MD  Encounter Date: 05/19/2022   Laqueta Carina, PTA 05/19/2022, 7:59 AM  [place]. [place], [place], [address] Phone: [phone]   Fax:  [place]. [place], [place], [address] Phone: [phone]   Fax:  [place]. [place], [place], [address] Phone: [phone]   Fax:  [phone]  Patient Details  Name: LEVOY GEISEN MRN: [id number] Date of Birth: 05-07-1932 Referring Provider:  Charleston Poot, MD  Encounter Date:  05/19/2022  Ethel Rana DPT 05/19/22 7:59 AM   Laqueta Carina, PTA 05/19/2022, 7:59 AM  [place]. [place], [place], [address] Phone: [phone]   Fax:  [place]. [place], [place], [address] Phone: [phone]   Fax:  [phone]  Patient Details  Name: KHYRON GARNO MRN: [id number] Date of Birth: 29-Jul-1932 Referring Provider:  Charleston Poot, MD  Encounter Date: 05/19/2022   Laqueta Carina, PTA 05/19/2022, 7:59 AM  [place]. [place], [place], [address] Phone: [phone]   Fax:  [place]. [place], [place], [address] Phone: [phone]   Fax:  [phone]  Patient Details  Name: IZIK BINGMAN MRN: [id number] Date of Birth: [birth year] Referring Provider:  Charleston Poot, MD  Encounter Date: 05/19/2022   Laqueta Carina, PTA 05/19/2022, 7:59 AM  [place]. [place], [place], [address] Phone: [phone]   Fax:  [phone]

## 2022-05-23 ENCOUNTER — Ambulatory Visit: Payer: Medicare Other | Admitting: Physical Therapy

## 2024-10-08 ENCOUNTER — Ambulatory Visit: Admitting: Physical Therapy
